# Patient Record
Sex: Male | Born: 1950 | Race: Black or African American | Hispanic: No | Marital: Single | Smoking: Never smoker
Health system: Southern US, Community
[De-identification: ages and names within clinical notes are randomized; demographics above are authoritative.]

## PROBLEM LIST (undated history)

## (undated) DIAGNOSIS — C61 Malignant neoplasm of prostate: Secondary | ICD-10-CM

---

## 2012-07-22 ENCOUNTER — Other Ambulatory Visit (HOSPITAL_COMMUNITY): Payer: Self-pay | Admitting: *Deleted

## 2012-07-22 ENCOUNTER — Other Ambulatory Visit: Payer: Self-pay | Admitting: *Deleted

## 2012-07-22 DIAGNOSIS — C61 Malignant neoplasm of prostate: Secondary | ICD-10-CM

## 2012-07-27 ENCOUNTER — Ambulatory Visit (HOSPITAL_COMMUNITY): Admission: RE | Admit: 2012-07-27 | Payer: Medicare Other | Source: Ambulatory Visit

## 2012-07-30 ENCOUNTER — Ambulatory Visit (HOSPITAL_COMMUNITY): Admission: RE | Admit: 2012-07-30 | Payer: Medicare Other | Source: Ambulatory Visit

## 2012-08-02 ENCOUNTER — Other Ambulatory Visit (HOSPITAL_COMMUNITY): Payer: Self-pay

## 2019-05-18 ENCOUNTER — Inpatient Hospital Stay (HOSPITAL_COMMUNITY): Payer: No Typology Code available for payment source

## 2019-05-18 ENCOUNTER — Encounter (HOSPITAL_COMMUNITY): Payer: Self-pay | Admitting: Pulmonary Disease

## 2019-05-18 ENCOUNTER — Emergency Department (HOSPITAL_COMMUNITY): Payer: No Typology Code available for payment source

## 2019-05-18 ENCOUNTER — Inpatient Hospital Stay (HOSPITAL_COMMUNITY)
Admission: EM | Admit: 2019-05-18 | Discharge: 2019-05-21 | DRG: 871 | Disposition: A | Discharge status: Hospice | Payer: No Typology Code available for payment source | Attending: Internal Medicine | Admitting: Internal Medicine

## 2019-05-18 DIAGNOSIS — E875 Hyperkalemia: Secondary | ICD-10-CM | POA: Diagnosis present

## 2019-05-18 DIAGNOSIS — A599 Trichomoniasis, unspecified: Secondary | ICD-10-CM | POA: Diagnosis present

## 2019-05-18 DIAGNOSIS — G9341 Metabolic encephalopathy: Secondary | ICD-10-CM | POA: Diagnosis present

## 2019-05-18 DIAGNOSIS — Z7952 Long term (current) use of systemic steroids: Secondary | ICD-10-CM

## 2019-05-18 DIAGNOSIS — G936 Cerebral edema: Secondary | ICD-10-CM | POA: Diagnosis present

## 2019-05-18 DIAGNOSIS — Z880 Allergy status to penicillin: Secondary | ICD-10-CM

## 2019-05-18 DIAGNOSIS — Z20822 Contact with and (suspected) exposure to covid-19: Secondary | ICD-10-CM | POA: Diagnosis present

## 2019-05-18 DIAGNOSIS — R7401 Elevation of levels of liver transaminase levels: Secondary | ICD-10-CM | POA: Diagnosis present

## 2019-05-18 DIAGNOSIS — R6521 Severe sepsis with septic shock: Secondary | ICD-10-CM | POA: Diagnosis present

## 2019-05-18 DIAGNOSIS — E872 Acidosis: Secondary | ICD-10-CM | POA: Diagnosis present

## 2019-05-18 DIAGNOSIS — Z792 Long term (current) use of antibiotics: Secondary | ICD-10-CM

## 2019-05-18 DIAGNOSIS — C786 Secondary malignant neoplasm of retroperitoneum and peritoneum: Secondary | ICD-10-CM | POA: Diagnosis present

## 2019-05-18 DIAGNOSIS — C61 Malignant neoplasm of prostate: Secondary | ICD-10-CM | POA: Diagnosis present

## 2019-05-18 DIAGNOSIS — R410 Disorientation, unspecified: Secondary | ICD-10-CM

## 2019-05-18 DIAGNOSIS — Z888 Allergy status to other drugs, medicaments and biological substances status: Secondary | ICD-10-CM

## 2019-05-18 DIAGNOSIS — E861 Hypovolemia: Secondary | ICD-10-CM | POA: Diagnosis present

## 2019-05-18 DIAGNOSIS — A419 Sepsis, unspecified organism: Principal | ICD-10-CM

## 2019-05-18 DIAGNOSIS — D61818 Other pancytopenia: Secondary | ICD-10-CM | POA: Diagnosis present

## 2019-05-18 DIAGNOSIS — C7951 Secondary malignant neoplasm of bone: Secondary | ICD-10-CM | POA: Diagnosis present

## 2019-05-18 DIAGNOSIS — Z7189 Other specified counseling: Secondary | ICD-10-CM

## 2019-05-18 DIAGNOSIS — R680 Hypothermia, not associated with low environmental temperature: Secondary | ICD-10-CM | POA: Diagnosis present

## 2019-05-18 DIAGNOSIS — E871 Hypo-osmolality and hyponatremia: Secondary | ICD-10-CM | POA: Diagnosis present

## 2019-05-18 DIAGNOSIS — N178 Other acute kidney failure: Secondary | ICD-10-CM | POA: Diagnosis present

## 2019-05-18 DIAGNOSIS — Z993 Dependence on wheelchair: Secondary | ICD-10-CM

## 2019-05-18 DIAGNOSIS — Z885 Allergy status to narcotic agent status: Secondary | ICD-10-CM

## 2019-05-18 DIAGNOSIS — C787 Secondary malignant neoplasm of liver and intrahepatic bile duct: Secondary | ICD-10-CM | POA: Diagnosis present

## 2019-05-18 DIAGNOSIS — R4182 Altered mental status, unspecified: Secondary | ICD-10-CM | POA: Diagnosis not present

## 2019-05-18 DIAGNOSIS — G893 Neoplasm related pain (acute) (chronic): Secondary | ICD-10-CM | POA: Diagnosis present

## 2019-05-18 DIAGNOSIS — E860 Dehydration: Secondary | ICD-10-CM | POA: Diagnosis present

## 2019-05-18 DIAGNOSIS — Z66 Do not resuscitate: Secondary | ICD-10-CM

## 2019-05-18 DIAGNOSIS — Z79891 Long term (current) use of opiate analgesic: Secondary | ICD-10-CM

## 2019-05-18 DIAGNOSIS — Z515 Encounter for palliative care: Secondary | ICD-10-CM

## 2019-05-18 DIAGNOSIS — N3000 Acute cystitis without hematuria: Secondary | ICD-10-CM | POA: Diagnosis present

## 2019-05-18 DIAGNOSIS — R569 Unspecified convulsions: Secondary | ICD-10-CM | POA: Diagnosis not present

## 2019-05-18 DIAGNOSIS — N179 Acute kidney failure, unspecified: Secondary | ICD-10-CM | POA: Diagnosis not present

## 2019-05-18 DIAGNOSIS — Z923 Personal history of irradiation: Secondary | ICD-10-CM

## 2019-05-18 DIAGNOSIS — C7931 Secondary malignant neoplasm of brain: Secondary | ICD-10-CM | POA: Diagnosis present

## 2019-05-18 DIAGNOSIS — L8990 Pressure ulcer of unspecified site, unspecified stage: Secondary | ICD-10-CM | POA: Insufficient documentation

## 2019-05-18 HISTORY — DX: Malignant neoplasm of prostate: C61

## 2019-05-18 LAB — POC SARS CORONAVIRUS 2 AG -  ED: SARS Coronavirus 2 Ag: NEGATIVE

## 2019-05-18 LAB — ETHANOL: Alcohol, Ethyl (B): 40 mg/dL — ABNORMAL HIGH (ref <10)

## 2019-05-18 LAB — COMPREHENSIVE METABOLIC PANEL
ALT: 68 U/L — ABNORMAL HIGH (ref 0–44)
ALT: 66 U/L — ABNORMAL HIGH (ref 0–44)
AST: 314 U/L — ABNORMAL HIGH (ref 15–41)
AST: 264 U/L — ABNORMAL HIGH (ref 15–41)
Albumin: 1.7 g/dL — ABNORMAL LOW (ref 3.5–5.0)
Albumin: 1.7 g/dL — ABNORMAL LOW (ref 3.5–5.0)
Alkaline Phosphatase: 2719 U/L — ABNORMAL HIGH (ref 38–126)
Alkaline Phosphatase: 2862 U/L — ABNORMAL HIGH (ref 38–126)
Anion gap: 22 — ABNORMAL HIGH (ref 5–15)
Anion gap: 16 — ABNORMAL HIGH (ref 5–15)
BUN: 40 mg/dL — ABNORMAL HIGH (ref 8–23)
BUN: 39 mg/dL — ABNORMAL HIGH (ref 8–23)
CO2: 11 mmol/L — ABNORMAL LOW (ref 22–32)
CO2: 14 mmol/L — ABNORMAL LOW (ref 22–32)
Calcium: 6.9 mg/dL — ABNORMAL LOW (ref 8.9–10.3)
Calcium: 6.6 mg/dL — ABNORMAL LOW (ref 8.9–10.3)
Chloride: 98 mmol/L (ref 98–111)
Chloride: 103 mmol/L (ref 98–111)
Creatinine, Ser: 4.1 mg/dL — ABNORMAL HIGH (ref 0.61–1.24)
Creatinine, Ser: 3 mg/dL — ABNORMAL HIGH (ref 0.61–1.24)
GFR calc Af Amer: 16 mL/min — ABNORMAL LOW (ref >60)
GFR calc Af Amer: 24 mL/min — ABNORMAL LOW (ref >60)
GFR calc non Af Amer: 14 mL/min — ABNORMAL LOW (ref >60)
GFR calc non Af Amer: 21 mL/min — ABNORMAL LOW (ref >60)
Glucose, Bld: 87 mg/dL (ref 70–99)
Glucose, Bld: 109 mg/dL — ABNORMAL HIGH (ref 70–99)
Potassium: 5.5 mmol/L — ABNORMAL HIGH (ref 3.5–5.1)
Potassium: 4.9 mmol/L (ref 3.5–5.1)
Sodium: 131 mmol/L — ABNORMAL LOW (ref 135–145)
Sodium: 133 mmol/L — ABNORMAL LOW (ref 135–145)
Total Bilirubin: 3 mg/dL — ABNORMAL HIGH (ref 0.3–1.2)
Total Bilirubin: 4.4 mg/dL — ABNORMAL HIGH (ref 0.3–1.2)
Total Protein: 4.3 g/dL — ABNORMAL LOW (ref 6.5–8.1)
Total Protein: 4.2 g/dL — ABNORMAL LOW (ref 6.5–8.1)

## 2019-05-18 LAB — HEMOGLOBIN AND HEMATOCRIT, BLOOD
HCT: 26.1 % — ABNORMAL LOW (ref 39.0–52.0)
Hemoglobin: 8.5 g/dL — ABNORMAL LOW (ref 13.0–17.0)

## 2019-05-18 LAB — CBC WITH DIFFERENTIAL/PLATELET
Abs Immature Granulocytes: 0.03 10*3/uL (ref 0.00–0.07)
Basophils Absolute: 0 10*3/uL (ref 0.0–0.1)
Basophils Relative: 0 %
Eosinophils Absolute: 0 10*3/uL (ref 0.0–0.5)
Eosinophils Relative: 0 %
HCT: 21.6 % — ABNORMAL LOW (ref 39.0–52.0)
Hemoglobin: 6.1 g/dL — CL (ref 13.0–17.0)
Immature Granulocytes: 1 %
Lymphocytes Relative: 39 %
Lymphs Abs: 0.9 10*3/uL (ref 0.7–4.0)
MCH: 25 pg — ABNORMAL LOW (ref 26.0–34.0)
MCHC: 28.2 g/dL — ABNORMAL LOW (ref 30.0–36.0)
MCV: 88.5 fL (ref 80.0–100.0)
Monocytes Absolute: 0.1 10*3/uL (ref 0.1–1.0)
Monocytes Relative: 4 %
Neutro Abs: 1.3 10*3/uL — ABNORMAL LOW (ref 1.7–7.7)
Neutrophils Relative %: 56 %
Platelets: 16 10*3/uL — CL (ref 150–400)
RBC: 2.44 MIL/uL — ABNORMAL LOW (ref 4.22–5.81)
RDW: 20.5 % — ABNORMAL HIGH (ref 11.5–15.5)
WBC: 2.3 10*3/uL — ABNORMAL LOW (ref 4.0–10.5)
nRBC: 7 % — ABNORMAL HIGH (ref 0.0–0.2)

## 2019-05-18 LAB — URINALYSIS, ROUTINE W REFLEX MICROSCOPIC
Glucose, UA: NEGATIVE mg/dL
Ketones, ur: 15 mg/dL — AB
Nitrite: POSITIVE — AB
Protein, ur: 100 mg/dL — AB
Specific Gravity, Urine: >1.03 — ABNORMAL HIGH (ref 1.005–1.030)
pH: 5 (ref 5.0–8.0)

## 2019-05-18 LAB — AMMONIA: Ammonia: 33 umol/L (ref 9–35)

## 2019-05-18 LAB — RESPIRATORY PANEL BY RT PCR (FLU A&B, COVID)
Influenza A by PCR: NEGATIVE
Influenza B by PCR: NEGATIVE
SARS Coronavirus 2 by RT PCR: NEGATIVE

## 2019-05-18 LAB — LACTIC ACID, PLASMA
Lactic Acid, Venous: 9.6 mmol/L (ref 0.5–1.9)
Lactic Acid, Venous: 6.8 mmol/L (ref 0.5–1.9)
Lactic Acid, Venous: 2.9 mmol/L (ref 0.5–1.9)

## 2019-05-18 LAB — URINALYSIS, MICROSCOPIC (REFLEX): Squamous Epithelial / HPF: NONE SEEN (ref 0–5)

## 2019-05-18 LAB — MRSA PCR SCREENING: MRSA by PCR: NEGATIVE

## 2019-05-18 LAB — CBG MONITORING, ED: Glucose-Capillary: 76 mg/dL (ref 70–99)

## 2019-05-18 LAB — T4, FREE: Free T4: 0.86 ng/dL (ref 0.61–1.12)

## 2019-05-18 LAB — PROCALCITONIN: Procalcitonin: 28.7 ng/mL

## 2019-05-18 LAB — HIV ANTIBODY (ROUTINE TESTING W REFLEX): HIV Screen 4th Generation wRfx: NONREACTIVE

## 2019-05-18 LAB — TSH: TSH: 3.98 u[IU]/mL (ref 0.350–4.500)

## 2019-05-18 LAB — GLUCOSE, CAPILLARY
Glucose-Capillary: 106 mg/dL — ABNORMAL HIGH (ref 70–99)
Glucose-Capillary: 113 mg/dL — ABNORMAL HIGH (ref 70–99)
Glucose-Capillary: 96 mg/dL (ref 70–99)

## 2019-05-18 LAB — PREPARE RBC (CROSSMATCH)

## 2019-05-18 LAB — ABO/RH: ABO/RH(D): B POS

## 2019-05-18 MED ORDER — NOREPINEPHRINE 4 MG/250ML-% IV SOLN
0.0000 ug/min | INTRAVENOUS | Status: DC
  Administered 2019-05-18: 3 ug/min via INTRAVENOUS
  Administered 2019-05-18: 11:00:00 5 ug/min via INTRAVENOUS
  Filled 2019-05-18 (×2): qty 250

## 2019-05-18 MED ORDER — DEXAMETHASONE SODIUM PHOSPHATE 10 MG/ML IJ SOLN: 10.0000 mg | Freq: Once | INTRAMUSCULAR | Status: DC

## 2019-05-18 MED ORDER — PIPERACILLIN-TAZOBACTAM 3.375 G IVPB 30 MIN
3.3750 g | Freq: Once | INTRAVENOUS | Status: AC
  Administered 2019-05-18: 11:00:00 3.375 g via INTRAVENOUS
  Filled 2019-05-18: qty 50

## 2019-05-18 MED ORDER — FENTANYL CITRATE (PF) 100 MCG/2ML IJ SOLN
25.0000 ug | INTRAMUSCULAR | Status: DC | PRN
  Administered 2019-05-18 – 2019-05-19 (×5): 25 ug via INTRAVENOUS
  Filled 2019-05-18 (×5): qty 2

## 2019-05-18 MED ORDER — VANCOMYCIN HCL 1500 MG/300ML IV SOLN
1500.0000 mg | Freq: Once | INTRAVENOUS | Status: AC
  Administered 2019-05-18: 11:00:00 1500 mg via INTRAVENOUS
  Filled 2019-05-18: qty 300

## 2019-05-18 MED ORDER — SODIUM CHLORIDE 0.9% IV SOLUTION: Freq: Once | INTRAVENOUS | Status: DC

## 2019-05-18 MED ORDER — SODIUM CHLORIDE 0.9 % IV SOLN
1.0000 g | INTRAVENOUS | Status: DC
  Administered 2019-05-18: 17:00:00 1 g via INTRAVENOUS
  Filled 2019-05-18 (×2): qty 10

## 2019-05-18 MED ORDER — LACTATED RINGERS IV BOLUS
1000.0000 mL | Freq: Once | INTRAVENOUS | Status: AC
  Administered 2019-05-18: 18:00:00 1000 mL via INTRAVENOUS

## 2019-05-18 MED ORDER — METRONIDAZOLE IN NACL 5-0.79 MG/ML-% IV SOLN
500.0000 mg | Freq: Three times a day (TID) | INTRAVENOUS | Status: DC
  Administered 2019-05-18 – 2019-05-19 (×4): 500 mg via INTRAVENOUS
  Filled 2019-05-18 (×4): qty 100

## 2019-05-18 MED ORDER — SODIUM CHLORIDE 0.9 % IV SOLN: INTRAVENOUS | Status: DC

## 2019-05-18 MED ORDER — SODIUM CHLORIDE 0.9 % IV BOLUS
1000.0000 mL | Freq: Once | INTRAVENOUS | Status: AC
  Administered 2019-05-18: 1000 mL via INTRAVENOUS

## 2019-05-18 MED ORDER — LACTATED RINGERS IV SOLN: INTRAVENOUS | Status: DC

## 2019-05-18 MED ORDER — DEXAMETHASONE SODIUM PHOSPHATE 10 MG/ML IJ SOLN
8.0000 mg | Freq: Two times a day (BID) | INTRAMUSCULAR | Status: DC
  Administered 2019-05-18: 23:00:00 8 mg via INTRAVENOUS
  Filled 2019-05-18 (×2): qty 0.8

## 2019-05-18 MED ORDER — SODIUM CHLORIDE 0.9 % IV BOLUS (SEPSIS)
500.0000 mL | Freq: Once | INTRAVENOUS | Status: AC
  Administered 2019-05-18: 10:00:00 500 mL via INTRAVENOUS

## 2019-05-18 MED ORDER — HYDROCORTISONE NA SUCCINATE PF 100 MG IJ SOLR: 50.0000 mg | Freq: Four times a day (QID) | INTRAMUSCULAR | Status: DC

## 2019-05-18 MED ORDER — CHLORHEXIDINE GLUCONATE CLOTH 2 % EX PADS
6.0000 | MEDICATED_PAD | Freq: Every day | CUTANEOUS | Status: DC
  Administered 2019-05-19 – 2019-05-21 (×2): 6 via TOPICAL

## 2019-05-18 MED ORDER — SODIUM CHLORIDE 0.9 % IV SOLN
INTRAVENOUS | Status: DC | PRN
  Administered 2019-05-18: 17:00:00 250 mL via INTRAVENOUS

## 2019-05-18 NOTE — ED Notes (Signed)
Help get patient undressed on the monitor patient is resting with call  Bell in reach and nurses at bedside

## 2019-05-18 NOTE — Progress Notes (Signed)
EEG complete - results pending 

## 2019-05-18 NOTE — Procedures (Signed)
Patient Name: Harry Dixon  MRN: [id number]  Epilepsy Attending: Lora Havens  Referring Physician/Provider: Salvadore Dom, NP Date: 05/18/2019  Duration: 23.56 mins  Patient history: 69 year old male with stage IV prostatic cancer, brain mets who presented with altered mental status.  EEG to evaluate for seizures.  Level of alertness: awake  AEDs during EEG study: None  Technical aspects: This EEG study was done with scalp electrodes positioned according to the 10-20 International system of electrode placement. Electrical activity was acquired at a sampling rate of 500Hz  and reviewed with a high frequency filter of 70Hz  and a low frequency filter of 1Hz . EEG data were recorded continuously and digitally stored.   Description: During awake state, no clear posterior dominant rhythm was seen.  EEG showed spikes at 1 Hz arising from right frontal-anterior temporal region (maximal F8).  EEG also showed continuous generalized and lateralized right hemisphere 3 to 6 Hz theta-delta slowing. Hyperventilation and photic stimulation were not performed due to AMS.  Abnormality -Spikes, right frontal-anterior temporal region -Continuous slow, generalized and lateralized right hemisphere  IMPRESSION: This study showed evidence of epileptogenicity arising from right frontal-anterior temporal region.  Additionally there was evidence of cortical dysfunction right hemisphere likely secondary to underlying metastatic disease. No seizures were seen throughout the recording.    Harry Dixon [name]

## 2019-05-18 NOTE — ED Triage Notes (Signed)
Pt here from home , pt had a syncopal episode while trying to get into w/c , pt received 2mg  of narcan for resp rate of 4 to 6 , pt also hypotensive , cbg 114 , pt is treated for CA at the [place]

## 2019-05-18 NOTE — ED Notes (Signed)
Pt more awake now , following commands at times , restraints removed for now , family at bedside pt remains on the levophed gtt

## 2019-05-18 NOTE — H&P (Signed)
NAME:  Harry Dixon, MRN:  [id number], DOB:  [birth year], LOS: 0 ADMISSION DATE:  05/18/2019, CONSULTATION DATE:  2/3 REFERRING MD:  Tyrone Nine, CHIEF COMPLAINT:  sepsis   Brief History   55 yom w/ what appears to be progressive stage IV prostate CA (initially involving mostly spine and pelvis). Not on therapy since June 2020. Presents 2/3 w/ working dx of acute metabolic encephalopathy w/ septic shock felt 2/2 UT source but also dx eval suggesting marked progression of his metastatic disease now involving: retroperitoneal cavity, liver and brain.   History of present illness   This is a 69 year old male w/ stage IV prostate cancer. Not on therapy since June 2020 due to poor tolerance. Was in usual state of health until evening of 2/2 when daughter noted he was more disoriented. Prior to that family noted decreased oral intake.  On the am of 2/3 he was much more confused. Had a syncopal event when family was trying to get him into w/c. EMS called on arrival he was breathing 4-6x/min. Was given narcan w/ some improvement. Was transferred to the ED. On arrival he was minimally responsive, hypotensive, lactic acid was 9.  Dx eval in ER:  Afebrile, tachycardic, K 5.5, scr 4.10, ast 314, alt 68, alk po4 2719, hgb 6.1, PLT 16, ammonia 33, urine + Trichomonas  ->CT abd/pelvis c/w extensive metastatic prostate CA w/ retroperitoneal LAN and diffuse hepatic and skeletal mets.  ->CT brain w/ diffuse skull based infiltration and evidence of multifocal right hemisphere brain mets w/ milf-mod vasogenic edema  Treatment in ER: IV fluids (81m/kg) Blood transfusion (x2 units) Flagyl and rocephin initiated PCCM asked to admit  Past Medical History  Metastatic prostate cancer (no chemo or radiation since June 2020), metastasis involving: lower spine, pelvis. 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 Needs help getting dressed.  Significant Hospital Events   2/3 admitted w/ working dx of septic shock and progressive metastasis of underlying prostate CA. IVFs, 2 units PRBC and abx initiated.   Consults:  Oncology   Procedures:    Significant Diagnostic Tests:  CT brain 2/3: 1. Diffuse skull base infiltration by tumor/metastasis, and evidence of multifocal right hemisphere brain metastases with mild-to-moderate vasogenic edema. 2. No significant intracranial mass effect. CT pevis 2/3: . 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 Metastatic retroperitoneal lymphadenopathy. Diffuse hepatic metastases, and diffuse skeletal metastases as above. 2. Trace bilateral pleural effusions. Renal [place]2/3>>> Micro Data:  UA 2/3 Trichomonas UC 2/3>>> BC 2/3>>> SARS POC : neg resp virus including COVID>>> RPR 2/3>>.    Antimicrobials:  Flagyl 2/3>>> Rocephin 2/3>>  Interim history/subjective:  More awake w/ IVFs   Objective   Blood pressure 104/66, pulse (Abnormal) 102, temperature 98.4 F (36.9 C), temperature source Axillary, resp. rate 14, SpO2 96 %.       No intake or output data in the 24 hours ending 05/18/19 1325 There were no vitals filed for this visit.  Examination: General: 69year old aam resting in bed. Still agitated at times when awake. Then goes back to sleep HENT: [place] sclera are mildly icteric MM no JVD Lungs: clear no accessory use  Cardiovascular: rrr w/out MRG Abdomen: not tender + bowel sounds  Extremities: warm LE very painful to palp chronic stasis changed.  Neuro: awake w/ voice. Confused. Moves all ext but mobility limited by  pain  GU: inc urine on arrival   Resolved Hospital Problem list     Assessment & Plan:  Acute metabolic encephalopathy. Multifactorial: septic shock, renal failure, acidosis, chronic narcotics but ALSO CT imaging of brain with acute finding of brain metastasis.   -neuro has improved w/ IV hydration to some degree. But given finding also worried about seizure Plan Supportive care->treating infection Spot EEG IV decadron '10mg'$  IV now then change to 8 mg bid  Serial neuro checks Holding sedating meds  Septic shock w/ multiple organ failure. Appears to be urinary tract source in setting of known prostate cancer.  -+  trichomonas  On UA. Worry polymicrobial.  Plan Admit to ICU Culture urine Culture blood Empiric rocephin  Cont flagyl  Sent RPR Cont IVFs in addition to transfusion  Peripheral norepi  Decadron for vasogenic edema should also help w/ stress dose steroids    Pancytopenia: Symptomatic anemia (acute on chronic), thrombocytopenia and mild neutropenia -suspect due to mix of malignancy and sepsis   Plan Transfuse 2 units.  F/u cbc tranfuse for hgb < 7 plts < 10 K or if develops evidence of bleeding  Acute renal failure Plan IVFs Renal dose meds Strict I&O Check bladder scan Try to avoid foley if able but obstruction is a concern  Renal US r/o obstruction   Severe metabolic acidosis (anion-gap/lactic acidosis), mild hyperkalemia  Plan Cont IVFs IV hydration  Serial chemistries F/u lactic acid  abg  Abnormal LFTs  -suspect this reflects a mix of liver metastasis AND shock liver Plan Trend->obtain am LFTs  Progressive metastatic prostate cancer  -initially mets to spine and pelvis. Now involving retroperitoneal cavity, liver and brain. Has vasogenic edema  -gets care at [facility] Will add decadron as above Has not had therapy since June. Seems unlikely to be candidate for treatment in future other than for palliative care.  Will d/w attending re: when or if to involve [facility] oncology   Best practice:  Diet: NPO except meds Pain/Anxiety/Delirium protocol (if indicated): NA VAP protocol (if indicated): NA DVT prophylaxis: scd GI prophylaxis: na Glucose control: ssi (w steroids) Mobility: BR Code Status: full  code-->discussed w/ daughter, would not want prolonged care or SNF  Family Communication: updated daughter on plan of care  Disposition:  Awaiting ICU admit  Labs   CBC: Recent Labs  Lab 05/18/19 0901  WBC 2.3*  NEUTROABS 1.3*  HGB 6.1*  HCT 21.6*  MCV 88.5  PLT 16*    Basic Metabolic Panel: Recent Labs  Lab 05/18/19 0901  NA 131*  K 5.5*  CL 98  CO2 11*  GLUCOSE 87  BUN 40*  CREATININE 4.10*  CALCIUM 6.9*   GFR: CrCl cannot be calculated (Unknown ideal weight.). Recent Labs  Lab 05/18/19 0901 05/18/19 0902 05/18/19 1115  WBC 2.3*  --   --   LATICACIDVEN  --  9.6* 6.8*    Liver Function Tests: Recent Labs  Lab 05/18/19 0901  AST 314*  ALT 68*  ALKPHOS 2,719*  BILITOT 3.0*  PROT 4.3*  ALBUMIN 1.7*   No results for input(s): LIPASE, AMYLASE in the last 168 hours. Recent Labs  Lab 05/18/19 0926  AMMONIA 33    ABG No results found for: PHART, PCO2ART, PO2ART, HCO3, TCO2, ACIDBASEDEF, O2SAT   Coagulation Profile: No results for input(s): INR, PROTIME in the last 168 hours.  Cardiac Enzymes: No results for input(s): CKTOTAL, CKMB, CKMBINDEX, TROPONINI in the last 168 hours.  HbA1C: No results found for:  HGBA1C  CBG: Recent Labs  Lab 05/18/19 0917  GLUCAP 76    Review of Systems:   Not able  Past Medical History  He,  has no past medical history on file.   Surgical History      Social History  Lives w/ daughter   Family History   His family history is not on file.   Allergies Allergies  Allergen Reactions  . Methadone Nausea And Vomiting  . Penicillin G Swelling    Did it involve swelling of the face/tongue/throat, SOB, or low BP? Yes Did it involve sudden or severe rash/hives, skin peeling, or any reaction on the inside of your mouth or nose? No Did you need to seek medical attention at a hospital or doctor's office? No When did it last happen?69 y.o. If all above answers are "NO", may proceed with cephalosporin use.    Pt. States Mouth swells and gets hives as well as getting jittery.  . Metformin Nausea Only     Home Medications  Prior to Admission medications   Medication Sig Start Date End Date Taking? Authorizing Provider  LORazepam (ATIVAN) 0.5 MG tablet Take 0.5 mg by mouth every 8 (eight) hours as needed for anxiety.   Yes [provider]  naproxen sodium (ALEVE) 220 MG tablet Take 220 mg by mouth 3 (three) times daily.   Yes [provider]  ondansetron (ZOFRAN) 4 MG tablet Take 4 mg by mouth every 8 (eight) hours as needed for nausea or vomiting.   Yes [provider]  oxyCODONE (OXY IR/ROXICODONE) 5 MG immediate release tablet Take 5 mg by mouth 3 (three) times daily.    Yes [provider]  predniSONE (DELTASONE) 5 MG tablet Take 5 mg by mouth 2 (two) times daily with a meal.   Yes [provider]  sulfamethoxazole-trimethoprim (BACTRIM DS) 800-160 MG tablet Take 1 tablet by mouth 2 (two) times daily.   Yes [provider]     Critical care time:  45 minutes    Erick Colace ACNP-BC [place] Pager # [phone] OR # [phone] if no answer

## 2019-05-18 NOTE — ED Provider Notes (Signed)
[facility] EMERGENCY DEPARTMENT Provider Note   CSN: [id number] Arrival date & time: 05/18/19  [id number]     History No chief complaint on file.   Harry Dixon is a 69 y.o. male.  69 yo M with a chief complaints of altered mental status.  Presumably found that way this morning.  Patient is undergoing radiation therapy for prostate cancer.  Primarily through the [place] but also at [facility].  Unable to provide any history.  Per EMS the patient was completely unresponsive breathing between 4 and 6 times a minute.  Had improvement of that with Narcan but was persistently altered.  The history is provided by the patient.  Illness Severity:  Severe Onset quality:  Sudden Duration:  1 day Timing:  Constant Progression:  Unchanged Chronicity:  New Associated symptoms: no abdominal pain, no chest pain, no congestion, no diarrhea, no fever, no headaches, no myalgias, no rash, no shortness of breath and no vomiting        History reviewed. No pertinent past medical history.  Patient Active Problem List   Diagnosis Date Noted  . Septic shock ([place]) 05/18/2019    History reviewed. No pertinent surgical history.     No family history on file.  Social History   Tobacco Use  . Smoking status: Not on file  Substance Use Topics  . Alcohol use: Not on file  . Drug use: Not on file    Home Medications Prior to Admission medications   Medication Sig Start Date End Date Taking? Authorizing Provider  LORazepam (ATIVAN) 0.5 MG tablet Take 0.5 mg by mouth every 8 (eight) hours as needed for anxiety.   Yes [provider]  naproxen sodium (ALEVE) 220 MG tablet Take 220 mg by mouth 3 (three) times daily.   Yes [provider]  ondansetron (ZOFRAN) 4 MG tablet Take 4 mg by mouth every 8 (eight) hours as needed for nausea or vomiting.   Yes [provider]  oxyCODONE (OXY IR/ROXICODONE) 5 MG immediate release tablet Take 5 mg by mouth 3 (three) times daily.     Yes [provider]  predniSONE (DELTASONE) 5 MG tablet Take 5 mg by mouth 2 (two) times daily with a meal.   Yes [provider]  sulfamethoxazole-trimethoprim (BACTRIM DS) 800-160 MG tablet Take 1 tablet by mouth 2 (two) times daily.   Yes [provider]    Allergies    Methadone, Penicillin g, and Metformin  Review of Systems   Review of Systems  Unable to perform ROS: Mental status change  Constitutional: Negative for chills and fever.  HENT: Negative for congestion and facial swelling.   Eyes: Negative for discharge and visual disturbance.  Respiratory: Negative for shortness of breath.   Cardiovascular: Negative for chest pain and palpitations.  Gastrointestinal: Negative for abdominal pain, diarrhea and vomiting.  Musculoskeletal: Negative for arthralgias and myalgias.  Skin: Negative for color change and rash.  Neurological: Negative for tremors, syncope and headaches.  Psychiatric/Behavioral: Negative for confusion and dysphoric mood.    Physical Exam Updated Vital Signs BP 107/67   Pulse (!) 109   Temp 98.4 F (36.9 C) (Axillary)   Resp 18   SpO2 98%   Physical Exam Vitals and nursing note reviewed.  Constitutional:      Appearance: He is well-developed.     Comments: Chronically ill-appearing  HENT:     Head: Normocephalic and atraumatic.  Eyes:     Pupils: Pupils are equal, round, and  reactive to light.  Neck:     Vascular: No JVD.  Cardiovascular:     Rate and Rhythm: Normal rate and regular rhythm.     Heart sounds: No murmur. No friction rub. No gallop.   Pulmonary:     Effort: No respiratory distress.     Breath sounds: No wheezing.  Abdominal:     General: There is no distension.     Tenderness: There is no abdominal tenderness. There is no guarding or rebound.  Musculoskeletal:        General: Normal range of motion.     Cervical back: Normal range of motion and neck supple.  Skin:    Coloration: Skin is not pale.       Findings: No rash.  Neurological:     Mental Status: He is alert.     Comments: Moving all 4 extremities spontaneously.  Mumbling incoherently.  Psychiatric:        Behavior: Behavior is uncooperative.     ED Results / Procedures / Treatments   Labs (all labs ordered are listed, but only abnormal results are displayed) Labs Reviewed  COMPREHENSIVE METABOLIC PANEL - Abnormal; Notable for the following components:      Result Value   Sodium 131 (*)    Potassium 5.5 (*)    CO2 11 (*)    BUN 40 (*)    Creatinine, Ser 4.10 (*)    Calcium 6.9 (*)    Total Protein 4.3 (*)    Albumin 1.7 (*)    AST 314 (*)    ALT 68 (*)    Alkaline Phosphatase 2,719 (*)    Total Bilirubin 3.0 (*)    GFR calc non Af Amer 14 (*)    GFR calc Af Amer 16 (*)    Anion gap 22 (*)    All other components within normal limits  ETHANOL - Abnormal; Notable for the following components:   Alcohol, Ethyl (B) 40 (*)    All other components within normal limits  CBC WITH DIFFERENTIAL/PLATELET - Abnormal; Notable for the following components:   WBC 2.3 (*)    RBC 2.44 (*)    Hemoglobin 6.1 (*)    HCT 21.6 (*)    MCH 25.0 (*)    MCHC 28.2 (*)    RDW 20.5 (*)    Platelets 16 (*)    nRBC 7.0 (*)    Neutro Abs 1.3 (*)    All other components within normal limits  URINALYSIS, ROUTINE W REFLEX MICROSCOPIC - Abnormal; Notable for the following components:   Color, Urine BROWN (*)    APPearance CLOUDY (*)    Specific Gravity, Urine >1.030 (*)    Hgb urine dipstick LARGE (*)    Bilirubin Urine MODERATE (*)    Ketones, ur 15 (*)    Protein, ur 100 (*)    Nitrite POSITIVE (*)    Leukocytes,Ua MODERATE (*)    All other components within normal limits  LACTIC ACID, PLASMA - Abnormal; Notable for the following components:   Lactic Acid, Venous 9.6 (*)    All other components within normal limits  URINALYSIS, MICROSCOPIC (REFLEX) - Abnormal; Notable for the following components:   Bacteria, UA MANY (*)     Trichomonas, UA PRESENT (*)    All other components within normal limits  LACTIC ACID, PLASMA - Abnormal; Notable for the following components:   Lactic Acid, Venous 6.8 (*)    All other components within normal limits  GLUCOSE,  CAPILLARY - Abnormal; Notable for the following components:   Glucose-Capillary 106 (*)    All other components within normal limits  RESPIRATORY PANEL BY RT PCR (FLU A&B, COVID)  URINE CULTURE  CULTURE, BLOOD (ROUTINE X 2)  CULTURE, BLOOD (ROUTINE X 2)  MRSA PCR SCREENING  AMMONIA  TSH  T4, FREE  HIV ANTIBODY (ROUTINE TESTING W REFLEX)  RPR  PATHOLOGIST SMEAR REVIEW  COMPREHENSIVE METABOLIC PANEL  COMPREHENSIVE METABOLIC PANEL  LACTIC ACID, PLASMA  LACTIC ACID, PLASMA  PROCALCITONIN  CBG MONITORING, ED  POC SARS CORONAVIRUS 2 AG -  ED  TYPE AND SCREEN  PREPARE RBC (CROSSMATCH)  ABO/RH  GC/CHLAMYDIA PROBE AMP ([place]) NOT AT [facility]    EKG EKG Interpretation  Date/Time:  Wednesday May 18 2019 08:55:51 EST Ventricular Rate:  91 PR Interval:    QRS Duration: 99 QT Interval:  391 QTC Calculation: 482 R Axis:   -11 Text Interpretation: Sinus rhythm Borderline prolonged QT interval No old tracing to compare Confirmed by Deno Etienne [phone]) on 05/18/2019 9:04:34 AM   Radiology CT ABDOMEN PELVIS WO CONTRAST  Result Date: 05/18/2019 CLINICAL DATA:  Abdominal pain, history of prostate cancer EXAM: CT ABDOMEN AND PELVIS WITHOUT CONTRAST TECHNIQUE: Multidetector CT imaging of the abdomen and pelvis was performed following the standard protocol without IV contrast. COMPARISON:  None. FINDINGS: Lower chest: There are small bilateral pleural effusions. No acute parenchymal lung disease. Incidental bilateral gynecomastia. Hepatobiliary: Too numerous to count hypodense masses throughout the liver compatible with diffuse hepatic metastases. Largest lesion within the inferior right lobe measuring approximately 4.5 cm reference image 33 of series 2.  Gallbladder is unremarkable. No biliary dilatation. Pancreas: Pancreas is unremarkable. Spleen: Spleen is normal in appearance and size. Adrenals/Urinary Tract: No evidence of urinary tract calculi or obstructive uropathy. Kidneys and adrenal glands are grossly unremarkable. Diffuse bladder wall thickening may reflect chronic bladder outlet obstruction. Stomach/Bowel: No evidence of bowel obstruction or ileus. Normal appendix right lower quadrant. Vascular/Lymphatic: There is extensive retroperitoneal lymphadenopathy. Index left para-aortic lymph node reference image 36 series 2 measures 18 x 22 mm. Lymphadenopathy extends to the pelvis, with largest left external iliac chain lymph node measuring 26 x 11 mm reference image 76 of series 2. No evidence of aortic aneurysm. Mild atherosclerosis of the distal aorta and its branches. Reproductive: Fiduciary markers are seen within the prostate. Prostate measures 2.8 x 3.5 by 3.1 cm. Other: No abdominal wall hernia.  No free fluid. Musculoskeletal: There is extensive diffuse bony metastatic disease involving all visualized bony structures. No evidence of pathologic fracture. Reconstructed images demonstrate no additional findings. IMPRESSION: 1. 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 Metastatic retroperitoneal lymphadenopathy. Diffuse hepatic metastases, and diffuse skeletal metastases as above. 2. Trace bilateral pleural effusions. Electronically Signed   By: Randa Ngo M.D.   On: 05/18/2019 12:26   CT HEAD WO CONTRAST  Result Date: 05/18/2019 CLINICAL DATA:  69 year old male with encephalopathy. "Unspecified cancer". EXAM: CT HEAD WITHOUT CONTRAST TECHNIQUE: Contiguous axial images were obtained from the base of the skull through the vertex without intravenous contrast. COMPARISON:  None. FINDINGS: Brain: Hypodensity in the right inferior frontal gyrus most suggestive of mass related vasogenic edema (series 2, image 14). Mild regional mass  effect including on the right frontal horn. Additional edema suspected in the right middle frontal gyrus on coronal image 20. Additionally, there is probable edema in the anterior right temporal lobe best seen on sagittal image 20. But no left hemisphere or posterior fossa  vasogenic edema identified. No associated intracranial hemorrhage. No midline shift or loss of basilar cisterns. No ventriculomegaly. No cortically based acute infarct identified. Vascular: Calcified atherosclerosis at the skull base. No suspicious intracranial vascular hyperdensity. Skull: Permeative, moth eaten appearance of bone throughout the skull base, including the central skull base, visible mandible, zygoma and maxilla. Smaller lytic lesions are scattered in the calvarium mostly along the coronal sutures. No definite extraosseous extension of tumor is identified by plain CT. Sinuses/Orbits: Abnormal sphenoid and right posterior ethmoid sinus opacification in conjunction with the highly abnormal appearance of the skull base. Similar partially opacified bilateral mastoid air cells and petrous apex air cells. The right tympanic cavity is partially opacified. Other: No orbit or scalp soft tissue mass identified. IMPRESSION: 1. Diffuse skull base infiltration by tumor/metastasis, and evidence of multifocal right hemisphere brain metastases with mild-to-moderate vasogenic edema. 2. No significant intracranial mass effect. Electronically Signed   By: Genevie Ann M.D.   On: 05/18/2019 12:34   DG Chest Port 1 View  Result Date: 05/18/2019 CLINICAL DATA:  Altered mental status, history of unspecified cancer EXAM: PORTABLE CHEST 1 VIEW COMPARISON:  None. FINDINGS: Diffuse patchy sclerotic change in the visualized osseous structures. Normal cardiomediastinal silhouette with normal heart size. No pneumothorax. No pleural effusion. Lungs appear clear, with no acute consolidative airspace disease and no pulmonary edema. No displaced fractures in the  visualized chest. IMPRESSION: 1. No active cardiopulmonary disease. 2. Diffuse patchy sclerotic change in the visualized osseous structures compatible with osteoblastic metastases. Electronically Signed   By: Ilona Sorrel M.D.   On: 05/18/2019 09:35    Procedures Procedures (including critical care time)  Medications Ordered in ED Medications  metroNIDAZOLE (FLAGYL) IVPB 500 mg (500 mg Intravenous New Bag/Given 05/18/19 1357)  norepinephrine (LEVOPHED) 4mg  in 2102mL premix infusion (5 mcg/min Intravenous New Bag/Given 05/18/19 1129)  cefTRIAXone (ROCEPHIN) 1 g in sodium chloride 0.9 % 100 mL IVPB (has no administration in time range)  dexamethasone (DECADRON) injection 10 mg (has no administration in time range)    Followed by  dexamethasone (DECADRON) injection 8 mg (has no administration in time range)  0.9 %  sodium chloride infusion (has no administration in time range)  fentaNYL (SUBLIMAZE) injection 25 mcg (has no administration in time range)  sodium chloride 0.9 % bolus 1,000 mL (0 mLs Intravenous Stopped 05/18/19 0959)  sodium chloride 0.9 % bolus 1,000 mL (0 mLs Intravenous Stopped 05/18/19 1105)  vancomycin (VANCOREADY) IVPB 1500 mg/300 mL (0 mg Intravenous Stopped 05/18/19 1356)  piperacillin-tazobactam (ZOSYN) IVPB 3.375 g (0 g Intravenous Stopped 05/18/19 1104)  sodium chloride 0.9 % bolus 500 mL (0 mLs Intravenous Stopped 05/18/19 1228)    ED Course  I have reviewed the triage vital signs and the nursing notes.  Pertinent labs & imaging results that were available during my care of the patient were reviewed by me and considered in my medical decision making (see chart for details).    MDM Rules/Calculators/A&P                      69 yo M with a chief complaints of altered mental status.  Brought in by EMS and found to be significantly altered.  Bradypnea.  Given narcan with come improvement but continued confusion.  Will obtain AMS workup, CT head, sepsis eval.  Hypothermic and  hypotensive on arrival add thyroid studies.    I was able to talk with the family on the phone.  Per his daughter  the patient has not been eating and drinking for the last couple weeks.  He has had significant decreased urine output.  No recent falls.  No recent medication changes.  He became confused yesterday.  No infectious symptoms no cough congestion fever vomiting or diarrhea.  No dark stool or blood in stool.  I tried to get him onto his wheelchair to take him to the car so they could take him to his oncologist appointment this morning and when they got him onto the chair he apparently stopped breathing.  Patient's lactate is 9. Also found to be anemic. Ordered blood transfusion. Code sepsis was initiated. Given 30 cc/kg of IV fluids. Patient has had some modest improvement of his blood pressure though still having maps below 65. Start the patient on Levophed. He has significant LFT elevation and total bili elevation. This is likely due to profound dehydration but will order a CT scan of the abdomen pelvis.  CT scan is without acute pathology.  He does have diffuse bony disease.  Mental status is significantly improved on reassessment.  Blood pressure is better but still on five mics of Levophed.  Discussed with critical care for evaluation.  CRITICAL CARE Performed by: Cecilio Asper   Total critical care time: 80 minutes  Critical care time was exclusive of separately billable procedures and treating other patients.  Critical care was necessary to treat or prevent imminent or life-threatening deterioration.  Critical care was time spent personally by me on the following activities: development of treatment plan with patient and/or surrogate as well as nursing, discussions with consultants, evaluation of patient's response to treatment, examination of patient, obtaining history from patient or surrogate, ordering and performing treatments and interventions, ordering and review of  laboratory studies, ordering and review of radiographic studies, pulse oximetry and re-evaluation of patient's condition.   Sepsis - Repeat Assessment  Performed at:    [id number]     Blood pressure 104/66, pulse (!) 102, temperature 97.7 F (36.5 C), temperature source Oral, resp. rate 14, SpO2 96 %.  Heart:     Tachycardic  Lungs:    CTA  Capillary Refill:   <2 sec  Peripheral Pulse:   Radial pulse palpable  Skin:     icteric     Final Clinical Impression(s) / ED Diagnoses Final diagnoses:  Septic shock ([place])  Acute cystitis without hematuria  Hypovolemia  Disorientation  AKI (acute kidney injury) ([place])  Trichomoniasis    Rx / DC Orders ED Discharge Orders    None       Deno Etienne, DO 05/18/19 1543

## 2019-05-18 NOTE — ED Notes (Signed)
Bair hugger placed on pt along with 2 liters O2 n/c  Pt remains confused pulling at lines and mask , family has been updated by Md and is the way to the ED

## 2019-05-18 NOTE — ED Notes (Signed)
Lab results was given to Nurse, May have to redraw labs.

## 2019-05-18 NOTE — Sepsis Progress Note (Signed)
Notified provider of need to order repeat lactic acid. ° °

## 2019-05-19 ENCOUNTER — Other Ambulatory Visit: Payer: Self-pay

## 2019-05-19 ENCOUNTER — Encounter (HOSPITAL_COMMUNITY): Payer: Self-pay | Admitting: Pulmonary Disease

## 2019-05-19 DIAGNOSIS — C61 Malignant neoplasm of prostate: Secondary | ICD-10-CM

## 2019-05-19 DIAGNOSIS — N3000 Acute cystitis without hematuria: Secondary | ICD-10-CM

## 2019-05-19 DIAGNOSIS — L8990 Pressure ulcer of unspecified site, unspecified stage: Secondary | ICD-10-CM | POA: Insufficient documentation

## 2019-05-19 DIAGNOSIS — E871 Hypo-osmolality and hyponatremia: Secondary | ICD-10-CM

## 2019-05-19 DIAGNOSIS — A419 Sepsis, unspecified organism: Principal | ICD-10-CM

## 2019-05-19 DIAGNOSIS — R7401 Elevation of levels of liver transaminase levels: Secondary | ICD-10-CM

## 2019-05-19 DIAGNOSIS — R6521 Severe sepsis with septic shock: Secondary | ICD-10-CM

## 2019-05-19 LAB — BPAM RBC
Blood Product Expiration Date: 202103032359
Blood Product Expiration Date: 202103032359
ISSUE DATE / TIME: 202102031227
ISSUE DATE / TIME: 202102031554
Unit Type and Rh: 7300
Unit Type and Rh: 7300

## 2019-05-19 LAB — COMPREHENSIVE METABOLIC PANEL
ALT: 64 U/L — ABNORMAL HIGH (ref 0–44)
ALT: 53 U/L — ABNORMAL HIGH (ref 0–44)
AST: 215 U/L — ABNORMAL HIGH (ref 15–41)
AST: 122 U/L — ABNORMAL HIGH (ref 15–41)
Albumin: 1.8 g/dL — ABNORMAL LOW (ref 3.5–5.0)
Albumin: 1.7 g/dL — ABNORMAL LOW (ref 3.5–5.0)
Alkaline Phosphatase: 2193 U/L — ABNORMAL HIGH (ref 38–126)
Alkaline Phosphatase: 2221 U/L — ABNORMAL HIGH (ref 38–126)
Anion gap: 13 (ref 5–15)
Anion gap: 15 (ref 5–15)
BUN: 37 mg/dL — ABNORMAL HIGH (ref 8–23)
BUN: 38 mg/dL — ABNORMAL HIGH (ref 8–23)
CO2: 13 mmol/L — ABNORMAL LOW (ref 22–32)
CO2: 12 mmol/L — ABNORMAL LOW (ref 22–32)
Calcium: 6.9 mg/dL — ABNORMAL LOW (ref 8.9–10.3)
Calcium: 6.9 mg/dL — ABNORMAL LOW (ref 8.9–10.3)
Chloride: 103 mmol/L (ref 98–111)
Chloride: 102 mmol/L (ref 98–111)
Creatinine, Ser: 2.76 mg/dL — ABNORMAL HIGH (ref 0.61–1.24)
Creatinine, Ser: 2.23 mg/dL — ABNORMAL HIGH (ref 0.61–1.24)
GFR calc Af Amer: 26 mL/min — ABNORMAL LOW (ref >60)
GFR calc Af Amer: 34 mL/min — ABNORMAL LOW (ref >60)
GFR calc non Af Amer: 23 mL/min — ABNORMAL LOW (ref >60)
GFR calc non Af Amer: 29 mL/min — ABNORMAL LOW (ref >60)
Glucose, Bld: 110 mg/dL — ABNORMAL HIGH (ref 70–99)
Glucose, Bld: 169 mg/dL — ABNORMAL HIGH (ref 70–99)
Potassium: 5.3 mmol/L — ABNORMAL HIGH (ref 3.5–5.1)
Potassium: 5 mmol/L (ref 3.5–5.1)
Sodium: 129 mmol/L — ABNORMAL LOW (ref 135–145)
Sodium: 129 mmol/L — ABNORMAL LOW (ref 135–145)
Total Bilirubin: 5.1 mg/dL — ABNORMAL HIGH (ref 0.3–1.2)
Total Bilirubin: 4.9 mg/dL — ABNORMAL HIGH (ref 0.3–1.2)
Total Protein: 4.2 g/dL — ABNORMAL LOW (ref 6.5–8.1)
Total Protein: 4.2 g/dL — ABNORMAL LOW (ref 6.5–8.1)

## 2019-05-19 LAB — CBC
HCT: 25.4 % — ABNORMAL LOW (ref 39.0–52.0)
Hemoglobin: 8.4 g/dL — ABNORMAL LOW (ref 13.0–17.0)
MCH: 27.5 pg (ref 26.0–34.0)
MCHC: 33.1 g/dL (ref 30.0–36.0)
MCV: 83.3 fL (ref 80.0–100.0)
Platelets: 17 10*3/uL — CL (ref 150–400)
RBC: 3.05 MIL/uL — ABNORMAL LOW (ref 4.22–5.81)
RDW: 17.7 % — ABNORMAL HIGH (ref 11.5–15.5)
WBC: 2.3 10*3/uL — ABNORMAL LOW (ref 4.0–10.5)
nRBC: 4.8 % — ABNORMAL HIGH (ref 0.0–0.2)

## 2019-05-19 LAB — TYPE AND SCREEN
ABO/RH(D): B POS
Antibody Screen: NEGATIVE
Unit division: 0
Unit division: 0

## 2019-05-19 LAB — URINE CULTURE: Culture: NO GROWTH

## 2019-05-19 LAB — RPR: RPR Ser Ql: NONREACTIVE

## 2019-05-19 MED ORDER — SODIUM CHLORIDE 0.9 % IV SOLN
1.0000 g | INTRAVENOUS | Status: DC
  Filled 2019-05-19: qty 10

## 2019-05-19 MED ORDER — METRONIDAZOLE 500 MG PO TABS
500.0000 mg | ORAL_TABLET | Freq: Two times a day (BID) | ORAL | Status: DC
  Administered 2019-05-19 – 2019-05-21 (×4): 500 mg via ORAL
  Filled 2019-05-19 (×5): qty 1

## 2019-05-19 MED ORDER — CEPHALEXIN 250 MG PO CAPS
250.0000 mg | ORAL_CAPSULE | Freq: Three times a day (TID) | ORAL | Status: DC
  Filled 2019-05-19 (×2): qty 1

## 2019-05-19 MED ORDER — METRONIDAZOLE IN NACL 5-0.79 MG/ML-% IV SOLN: 500.0000 mg | Freq: Two times a day (BID) | INTRAVENOUS | Status: DC

## 2019-05-19 MED ORDER — HYDROMORPHONE HCL 1 MG/ML IJ SOLN
0.5000 mg | INTRAMUSCULAR | Status: DC | PRN
  Administered 2019-05-19: 14:00:00 0.5 mg via INTRAVENOUS
  Filled 2019-05-19: qty 1

## 2019-05-19 MED ORDER — DEXAMETHASONE SODIUM PHOSPHATE 10 MG/ML IJ SOLN
8.0000 mg | Freq: Two times a day (BID) | INTRAMUSCULAR | Status: DC
  Administered 2019-05-19 – 2019-05-20 (×3): 8 mg via INTRAVENOUS
  Filled 2019-05-19 (×4): qty 0.8

## 2019-05-19 MED ORDER — CEPHALEXIN 250 MG PO CAPS
250.0000 mg | ORAL_CAPSULE | Freq: Two times a day (BID) | ORAL | Status: DC
  Administered 2019-05-19 – 2019-05-20 (×3): 250 mg via ORAL
  Filled 2019-05-19 (×3): qty 1

## 2019-05-19 NOTE — Plan of Care (Signed)
  Problem: Clinical Measurements: Goal: Respiratory complications will improve Outcome: Progressing   Problem: Coping: Goal: Level of anxiety will decrease Outcome: Progressing   Problem: Elimination: Goal: Will not experience complications related to urinary retention Outcome: Progressing   Problem: Safety: Goal: Ability to remain free from injury will improve Outcome: Progressing   Problem: Skin Integrity: Goal: Risk for impaired skin integrity will decrease Outcome: Progressing   

## 2019-05-19 NOTE — Progress Notes (Signed)
NAME:  Harry Dixon, MRN:  [id number], DOB:  [birth year], LOS: 1 ADMISSION DATE:  05/18/2019, CHIEF COMPLAINT:  hypotension  Brief History   40 yom w/ what appears to be progressive stage IV prostate CA (initially involving mostly spine and pelvis). Not on therapy since June 2020. Presents 2/3 w/ working dx of acute metabolic encephalopathy w/ septic shock felt 2/2 UT source but also dx eval suggesting marked progression of his metastatic disease now involving: retroperitoneal cavity, liver and brain.   Consults:  Palliative Care  Significant Diagnostic Tests:  UA with +WBC and trichomonas  Micro Data:  UA with +WBC, +bacteria and trichomonas  Antimicrobials:  Flagyl, ceftriaxone  Interim history/subjective:  Overnight norepinephrine discontinued. Never required central line or A-line. Hungry.   Objective   Blood pressure (!) 93/56, pulse 86, temperature 98.7 F (37.1 C), temperature source Oral, resp. rate 15, weight 77.3 kg, SpO2 100 %.        Intake/Output Summary (Last 24 hours) at 05/19/2019 1004 Last data filed at 05/19/2019 0800 Gross per 24 hour  Intake 3487.58 ml  Output 50 ml  Net 3437.58 ml   Filed Weights   05/19/19 0645  Weight: 77.3 kg    Examination: General: elderly man, no apparent distress HENT: mmm Lungs: ctab Cardiovascular: RRR, no mrg Abdomen: soft, nontender Extremities: no edema Neuro: normal speech, moves all 4 extremities   Assessment & Plan:  Harry Dixon is a 69 y.o. man with metastatic prostate cancer who presented with   Circulatory versus septic shock Metastatic prostate cancer with mets to the brain liver and retroperitoneal cavity Acute liver injury secondary to hepatic metastases Urinary tract infection Possible Trichomonas infection Hyponatremia  Treat trichomonas with flagyl twice daily.  I changes from IV 3 times daily to twice daily.  I doubt this represents a true trichomonas infection and is likely polymicrobial in the setting  of his prostate cancer, however we will treated to be in the safe side. Will narrow for UTI coverage with oral Keflex.  Off pressors.  Consult palliative care for hospice evaluation. Suspect he is decompensating from progressive cancer.  We will continue steroids for now for brain mets.  He has not been getting these at home.  He has been off all radiation and chemotherapy since June 2020.  Best practice:  Diet: Regular diet Pain/Anxiety/Delirium protocol (if indicated): Pain control DVT prophylaxis: SCDs Glucose control: Controlled Foley none Mobility: out Of bed with assist Code Status: DNR Disposition: He is stable for transfer to regular nursing floor.  [facility] to assume care 2/5   Labs   CBC: Recent Labs  Lab 05/18/19 0901 05/18/19 2140 05/19/19 0241  WBC 2.3*  --  2.3*  NEUTROABS 1.3*  --   --   HGB 6.1* 8.5* 8.4*  HCT 21.6* 26.1* 25.4*  MCV 88.5  --  83.3  PLT 16*  --  17*    Basic Metabolic Panel: Recent Labs  Lab 05/18/19 0901 05/18/19 2140 05/19/19 0241  NA 131* 133* 129*  K 5.5* 4.9 5.3*  CL 98 103 103  CO2 11* 14* 13*  GLUCOSE 87 109* 110*  BUN 40* 39* 37*  CREATININE 4.10* 3.00* 2.76*  CALCIUM 6.9* 6.6* 6.9*   GFR: CrCl cannot be calculated (Unknown ideal weight.). Recent Labs  Lab 05/18/19 0901 05/18/19 0902 05/18/19 1115 05/18/19 2140 05/19/19 0241  PROCALCITON 28.70  --   --   --   --   WBC 2.3*  --   --   --  2.3*  LATICACIDVEN  --  9.6* 6.8* 2.9*  --     Liver Function Tests: Recent Labs  Lab 05/18/19 0901 05/18/19 2140 05/19/19 0241  AST 314* 264* 215*  ALT 68* 66* 64*  ALKPHOS 2,719* 2,862* 2,193*  BILITOT 3.0* 4.4* 5.1*  PROT 4.3* 4.2* 4.2*  ALBUMIN 1.7* 1.7* 1.8*   No results for input(s): LIPASE, AMYLASE in the last 168 hours. Recent Labs  Lab 05/18/19 0926  AMMONIA 33    ABG No results found for: PHART, PCO2ART, PO2ART, HCO3, TCO2, ACIDBASEDEF, O2SAT   Coagulation Profile: No results for input(s): INR, PROTIME  in the last 168 hours.  Cardiac Enzymes: No results for input(s): CKTOTAL, CKMB, CKMBINDEX, TROPONINI in the last 168 hours.  HbA1C: No results found for: HGBA1C  CBG: Recent Labs  Lab 05/18/19 0917 05/18/19 1519 05/18/19 1938 05/18/19 2315  GLUCAP 76 106* 113* 96

## 2019-05-19 NOTE — Progress Notes (Signed)
Pt transferred to 5M22 via bed. Report called to Darylene Price, Therapist, sports. Pt's VS stable. No apparent distress.His daughter was notified of transfer and room number.

## 2019-05-20 DIAGNOSIS — N179 Acute kidney failure, unspecified: Secondary | ICD-10-CM

## 2019-05-20 DIAGNOSIS — Z7189 Other specified counseling: Secondary | ICD-10-CM

## 2019-05-20 DIAGNOSIS — Z515 Encounter for palliative care: Secondary | ICD-10-CM

## 2019-05-20 DIAGNOSIS — R4182 Altered mental status, unspecified: Secondary | ICD-10-CM

## 2019-05-20 DIAGNOSIS — Z66 Do not resuscitate: Secondary | ICD-10-CM

## 2019-05-20 LAB — PATHOLOGIST SMEAR REVIEW

## 2019-05-20 MED ORDER — CEPHALEXIN 500 MG PO CAPS
500.0000 mg | ORAL_CAPSULE | Freq: Two times a day (BID) | ORAL | Status: DC
  Administered 2019-05-20 – 2019-05-21 (×2): 500 mg via ORAL
  Filled 2019-05-20 (×3): qty 1

## 2019-05-20 MED ORDER — SODIUM CHLORIDE 0.9 % IV SOLN: 250.0000 mg | Freq: Two times a day (BID) | INTRAVENOUS | Status: DC

## 2019-05-20 MED ORDER — LEVETIRACETAM 250 MG PO TABS
250.0000 mg | ORAL_TABLET | Freq: Two times a day (BID) | ORAL | Status: DC
  Administered 2019-05-20 – 2019-05-21 (×3): 250 mg via ORAL
  Filled 2019-05-20 (×3): qty 1

## 2019-05-20 MED ORDER — LORAZEPAM 2 MG/ML IJ SOLN: 2.0000 mg | INTRAMUSCULAR | Status: DC | PRN

## 2019-05-20 MED ORDER — PANTOPRAZOLE SODIUM 40 MG PO TBEC
40.0000 mg | DELAYED_RELEASE_TABLET | Freq: Every day | ORAL | Status: DC
  Administered 2019-05-20 – 2019-05-21 (×2): 40 mg via ORAL
  Filled 2019-05-20 (×2): qty 1

## 2019-05-20 MED ORDER — DEXAMETHASONE 4 MG PO TABS
4.0000 mg | ORAL_TABLET | Freq: Two times a day (BID) | ORAL | Status: DC
  Administered 2019-05-20 – 2019-05-21 (×2): 4 mg via ORAL
  Filled 2019-05-20 (×3): qty 1

## 2019-05-20 NOTE — Consult Note (Signed)
Consultation Note Date: 05/20/2019   Patient Name: Harry Dixon  DOB: October 13, 1950  MRN: [id number]  Age / Sex: 69 y.o., male  PCP: Patient, No Pcp Per Referring Physician: Domenic Polite, MD  Reason for Consultation: Establishing goals of care, End stage prostate cancer  HPI/Patient Profile:  69 y.o. w/ what appears to be progressive stage IV prostate CA (initially involving mostly spine and pelvis). Not on therapy since June 2020. Presents 2/3 w/ working dx of acute metabolic encephalopathy w/ septic shock felt 2/2 UT source but also dx eval suggesting marked progression of his metastatic disease now involving: retroperitoneal cavity, liver and brain.  Palliative care consult requested due to metastatic prostate cancer. Presently deemed as end stage.   Clinical Assessment and Goals of Care: I have reviewed medical records including EPIC notes, labs and imaging, received report from bedside RN, assessed the patient.    I met with Harry Dixon and his daughter,  Harry Dixon at bedside to further discuss diagnosis prognosis, GOC, EOL wishes, disposition and options.   I introduced Palliative Medicine as specialized medical care for people living with serious illness. It focuses on providing relief from the symptoms and stress of a serious illness. The goal is to improve quality of life for both the patient and the family.  Harry Dixon said that she was aware we would be meeting. In preparation she had already spoken to her brothers and sisters. The patient himself knows that his cancer has spread to the point whereby treatments would be of little benefit. We talked about his love of his family. He has five children though his eldest son is deceased. He is worried about his childrens survival without him. Harry Dixon was able to reassure him that they will all be alright in his absence. Patient and family are experiencing anticipatory  grief presently.   We discussed in greater detail what hospice is. Endorsed that the goal would be a directed focus on symptom management. We talked about focusing on the quality of Harry Dixon's life at home and not how many days he has left. He was very tearful stating that he has fought this illness for the last seven years. He has given it all he has been able to. We discussed him being okay with this new reality.   Harry Dixon and I talked about next steps. She is hopeful to get him home as soon as able.   Harry Dixon was encouraged to bring in [facility] paperwork for our completion.  Patient is DNR/DNI, no escalation of care or aggressive interventions. Plan will be to transition home with hospice.   Discussed with patient the importance of continued conversation with family and their  medical providers regarding overall plan of care and treatment options, ensuring decisions are within the context of the patients values and GOCs.  Decision Maker: Harry Dixon [phone]   SUMMARY OF RECOMMENDATIONS   DNR/DNI  TOC for Hospice Consult  Chaplain consult  Code Status/Advance Care Planning:  DNR  Symptom Management:  Pain, d/t metastatic disease:  - Agree with  dexamethasone   - Dilaudid 0.5-'1mg'$  IVP for breakthrough pain can transition to PO formulation prior to discharge  Palliative Prophylaxis:   Aspiration, Bowel Regimen, Delirium Protocol, Eye Care, Frequent Pain Assessment, Oral Care, Palliative Wound Care and Turn Reposition  Additional Recommendations (Limitations, Scope, Preferences):  Avoid Hospitalization and Minimize Medications  Psycho-social/Spiritual:   Desire for further Chaplaincy support: YES  Additional Recommendations: Caregiving  Support/Resources and Education on Hospice  Prognosis:   < 6 months  Discharge Planning: Home with Hospice     Primary Diagnoses: Present on Admission: . Septic shock ([place])  I have reviewed the medical record, interviewed the  patient and family, and examined the patient. The following aspects are pertinent.  Past Medical History:  Diagnosis Date  . Prostate cancer [facility])    Social History   Socioeconomic History  . Marital status: Single    Spouse name: Not on file  . Number of children: Not on file  . Years of education: Not on file  . Highest education level: Not on file  Occupational History  . Not on file  Tobacco Use  . Smoking status: Never Smoker  . Smokeless tobacco: Never Used  Substance and Sexual Activity  . Alcohol use: Never  . Drug use: Not on file  . Sexual activity: Not on file  Other Topics Concern  . Not on file  Social History Narrative  . Not on file   Social Determinants of Health   Financial Resource Strain:   . Difficulty of Paying Living Expenses: Not on file  Food Insecurity:   . Worried About Charity fundraiser in the Last Year: Not on file  . Ran Out of Food in the Last Year: Not on file  Transportation Needs:   . Lack of Transportation (Medical): Not on file  . Lack of Transportation (Non-Medical): Not on file  Physical Activity:   . Days of Exercise per Week: Not on file  . Minutes of Exercise per Session: Not on file  Stress:   . Feeling of Stress : Not on file  Social Connections:   . Frequency of Communication with Friends and Family: Not on file  . Frequency of Social Gatherings with Friends and Family: Not on file  . Attends Religious Services: Not on file  . Active Member of Clubs or Organizations: Not on file  . Attends Archivist Meetings: Not on file  . Marital Status: Not on file   History reviewed. No pertinent family history. Scheduled Meds: . cephALEXin  250 mg Oral Q12H  . Chlorhexidine Gluconate Cloth  6 each Topical Daily  . dexamethasone (DECADRON) injection  8 mg Intravenous Q12H  . metroNIDAZOLE  500 mg Oral Q12H   Continuous Infusions: . sodium chloride 10 mL/hr at 05/19/19 2057  . sodium chloride 10 mL/hr at 05/19/19  0700   PRN Meds:.sodium chloride, HYDROmorphone (DILAUDID) injection Medications Prior to Admission:  Prior to Admission medications   Medication Sig Start Date End Date Taking? Authorizing Provider  LORazepam (ATIVAN) 0.5 MG tablet Take 0.5 mg by mouth every 8 (eight) hours as needed for anxiety.   Yes [provider]  naproxen sodium (ALEVE) 220 MG tablet Take 220 mg by mouth 3 (three) times daily.   Yes [provider]  ondansetron (ZOFRAN) 4 MG tablet Take 4 mg by mouth every 8 (eight) hours as needed for nausea or vomiting.   Yes [provider]  oxyCODONE (OXY IR/ROXICODONE) 5 MG immediate release tablet Take  5 mg by mouth 3 (three) times daily.    Yes [provider]  predniSONE (DELTASONE) 5 MG tablet Take 5 mg by mouth 2 (two) times daily with a meal.   Yes [provider]   Allergies  Allergen Reactions  . Methadone Nausea And Vomiting  . Metformin Nausea Only   Review of Systems  Constitutional: Positive for activity change and appetite change.   Physical Exam Vitals and nursing note reviewed.  HENT:     Head: Normocephalic.     Nose: Nose normal.     Mouth/Throat:     Mouth: Mucous membranes are dry.  Eyes:     Pupils: Pupils are equal, round, and reactive to light.  Cardiovascular:     Rate and Rhythm: Normal rate and regular rhythm.  Pulmonary:     Effort: Pulmonary effort is normal.  Abdominal:     Palpations: Abdomen is soft.  Musculoskeletal:     Cervical back: Normal range of motion.  Skin:    General: Skin is warm and dry.     Capillary Refill: Capillary refill takes less than 2 seconds.  Neurological:     Mental Status: He is alert and oriented to person, place, and time.    Vital Signs: BP 111/74 (BP Location: Right Arm)   Pulse 81   Temp 98 F (36.7 C) (Oral)   Resp 18   Ht '5\' 11"'$  (1.803 m)   Wt 77.3 kg   SpO2 100%   BMI 23.77 kg/m  Pain Scale: Faces   Pain Score: 0-No pain  SpO2: SpO2: 100  % O2 Device:SpO2: 100 % O2 Flow Rate: .O2 Flow Rate (L/min): 2 L/min  IO: Intake/output summary:   Intake/Output Summary (Last 24 hours) at 05/20/2019 0744 Last data filed at 05/20/2019 0600 Gross per 24 hour  Intake 1108.57 ml  Output 700 ml  Net 408.57 ml   LBM: Last BM Date: (PTA) Baseline Weight: Weight: 77.3 kg Most recent weight: Weight: 77.3 kg     Palliative Assessment/Data: 30%   Time In: 1200 Time Out: 1310 Time Total: 70 Greater than 50%  of this time was spent counseling and coordinating care related to the above assessment and plan.  Signed by: Rosezella Rumpf, NP   Please contact Palliative Medicine Team phone at [phone] for questions and concerns.  For individual provider: See Shea Evans

## 2019-05-20 NOTE — Progress Notes (Signed)
PROGRESS NOTE    Harry Dixon  [id number] DOB: 09-Apr-1951 DOA: 05/18/2019 PCP: Patient, No Pcp Per  Brief Narrative: 69 yom w/ what appears to be progressive stage IV prostate CA (initially involving mostly spine and pelvis). Not on therapy since June 2020. Presents 2/3 w/ working dx of acute metabolic encephalopathy w/ septic shock felt 2/2 UT source but also dx eval suggesting marked progression of his metastatic disease now involving: retroperitoneal cavity, liver and brain etc. -Treated with antibiotics, fluid resuscitation and pressors -Palliative medicine consulted for goals of care, consideration of hospice -Transferred from P[facility] to [facility] 2/5   Assessment & Plan:   Septic shock -Presumed to be secondary to UTI -Urine culture negative, blood cultures also negative -Treated with pressors, fluid resuscitation -IV fluids discontinued, continue Keflex  Widely metastatic prostate cancer Brain metastasis Extensive skeletal, liver mets -Prognosis appears poor, started on Decadron this admit, will change to p.o. -Followed by oncology at [facility], patient reports that hospice was recommended at last visit approximately a month ago -Palliative medicine consulted for goals of care  Severe protein calorie malnutrition  Ambulatory dysfunction -Wheelchair-bound at baseline  Abnormal EEG -likely due to brain mets -now on decadron   DVT prophylaxis: SCDs Code Status: DNR Family Communication: No family at bedside will update daughter Disposition Plan: Home tomorrow if stable  Consultants:   Palliative   Procedures:   Antimicrobials:    Subjective: -Complains of aches and pains all over,  Objective: Vitals:   05/19/19 2038 05/19/19 2245 05/20/19 0504 05/20/19 0852  BP: 105/67  111/74 101/64  Pulse: 100  81 81  Resp: 18  18 20   Temp: 98.6 F (37 C)  98 F (36.7 C) 98.2 F (36.8 C)  TempSrc: Oral  Oral Oral  SpO2: 94%  100% 100%  Weight:      Height:  5\' 11"   (1.803 m)      Intake/Output Summary (Last 24 hours) at 05/20/2019 1219 Last data filed at 05/20/2019 [id number] Gross per 24 hour  Intake 953.57 ml  Output 1000 ml  Net -46.43 ml   Filed Weights   05/19/19 0645  Weight: 77.3 kg    Examination:  General exam: Chronically ill male, appears much older than stated age, AAOx3 Respiratory system: Clear Cardiovascular system: S1 & S2 heard, RRR. Gastrointestinal system: Abdomen is nondistended, soft and nontender.Normal bowel sounds heard. Central nervous system: Alert and oriented, severe lower extremity weakness bilaterally Extremities: Chronic venous stasis changes, scaling, profound lower leg weakness Skin: No rashes Psychiatry:  Mood & affect appropriate.   Data Reviewed:   CBC: Recent Labs  Lab 05/18/19 0901 05/18/19 2140 05/19/19 0241  WBC 2.3*  --  2.3*  NEUTROABS 1.3*  --   --   HGB 6.1* 8.5* 8.4*  HCT 21.6* 26.1* 25.4*  MCV 88.5  --  83.3  PLT 16*  --  17*   Basic Metabolic Panel: Recent Labs  Lab 05/18/19 0901 05/18/19 2140 05/19/19 0241 05/19/19 1656  NA 131* 133* 129* 129*  K 5.5* 4.9 5.3* 5.0  CL 98 103 103 102  CO2 11* 14* 13* 12*  GLUCOSE 87 109* 110* 169*  BUN 40* 39* 37* 38*  CREATININE 4.10* 3.00* 2.76* 2.23*  CALCIUM 6.9* 6.6* 6.9* 6.9*   GFR: Estimated Creatinine Clearance: 34.2 mL/min (A) (by C-G formula based on SCr of 2.23 mg/dL (H)). Liver Function Tests: Recent Labs  Lab 05/18/19 0901 05/18/19 2140 05/19/19 0241 05/19/19 1656  AST 314* 264* 215* 122*  ALT 68* 66* 64* 53*  ALKPHOS 2,719* 2,862* 2,193* 2,221*  BILITOT 3.0* 4.4* 5.1* 4.9*  PROT 4.3* 4.2* 4.2* 4.2*  ALBUMIN 1.7* 1.7* 1.8* 1.7*   No results for input(s): LIPASE, AMYLASE in the last 168 hours. Recent Labs  Lab 05/18/19 0926  AMMONIA 33   Coagulation Profile: No results for input(s): INR, PROTIME in the last 168 hours. Cardiac Enzymes: No results for input(s): CKTOTAL, CKMB, CKMBINDEX, TROPONINI in the last 168  hours. BNP (last 3 results) No results for input(s): PROBNP in the last 8760 hours. HbA1C: No results for input(s): HGBA1C in the last 72 hours. CBG: Recent Labs  Lab 05/18/19 0917 05/18/19 1519 05/18/19 1938 05/18/19 2315  GLUCAP 76 106* 113* 96   Lipid Profile: No results for input(s): CHOL, HDL, LDLCALC, TRIG, CHOLHDL, LDLDIRECT in the last 72 hours. Thyroid Function Tests: Recent Labs    05/18/19 0926  TSH 3.980  FREET4 0.86   Anemia Panel: No results for input(s): VITAMINB12, FOLATE, FERRITIN, TIBC, IRON, RETICCTPCT in the last 72 hours. Urine analysis:    Component Value Date/Time   COLORURINE BROWN (A) 05/18/2019 0932   APPEARANCEUR CLOUDY (A) 05/18/2019 0932   LABSPEC >1.030 (H) 05/18/2019 0932   PHURINE 5.0 05/18/2019 0932   GLUCOSEU NEGATIVE 05/18/2019 0932   HGBUR LARGE (A) 05/18/2019 0932   BILIRUBINUR MODERATE (A) 05/18/2019 0932   KETONESUR 15 (A) 05/18/2019 0932   PROTEINUR 100 (A) 05/18/2019 0932   NITRITE POSITIVE (A) 05/18/2019 0932   LEUKOCYTESUR MODERATE (A) 05/18/2019 0932   Sepsis Labs: @LABRCNTIP (procalcitonin:4,lacticidven:4)  ) Recent Results (from the past 240 hour(s))  Blood Cultures (routine x 2)     Status: None (Preliminary result)   Collection Time: 05/18/19  9:18 AM   Specimen: BLOOD  Result Value Ref Range Status   Specimen Description BLOOD RIGHT ANTECUBITAL  Final   Special Requests   Final    BOTTLES DRAWN AEROBIC AND ANAEROBIC Blood Culture results may not be optimal due to an inadequate volume of blood received in culture bottles   Culture   Final    NO GROWTH 1 DAY Performed at [facility], [place] [address], [place], [place] [address]    Report Status PENDING  Incomplete  Blood Cultures (routine x 2)     Status: None (Preliminary result)   Collection Time: 05/18/19  9:26 AM   Specimen: BLOOD  Result Value Ref Range Status   Specimen Description BLOOD LEFT ANTECUBITAL  Final   Special Requests   Final    BOTTLES  DRAWN AEROBIC AND ANAEROBIC Blood Culture adequate volume   Culture   Final    NO GROWTH 1 DAY Performed at [facility], [place] [address], [place], [place] [address]    Report Status PENDING  Incomplete  Urine culture     Status: None   Collection Time: 05/18/19  9:32 AM   Specimen: Urine, Random  Result Value Ref Range Status   Specimen Description URINE, RANDOM  Final   Special Requests NONE  Final   Culture   Final    NO GROWTH Performed at [facility], [place] [address], [place], [place] [address]    Report Status 05/19/2019 FINAL  Final  Respiratory Panel by RT PCR (Flu A&B, Covid) - Nasopharyngeal Swab     Status: None   Collection Time: 05/18/19 12:48 PM   Specimen: Nasopharyngeal Swab  Result Value Ref Range Status   SARS Coronavirus 2 by RT PCR  NEGATIVE NEGATIVE Final    Comment: (NOTE) SARS-CoV-2 target nucleic acids are NOT DETECTED. The SARS-CoV-2 RNA is generally detectable in upper respiratoy specimens during the acute phase of infection. The lowest concentration of SARS-CoV-2 viral copies this assay can detect is 131 copies/mL. A negative result does not preclude SARS-Cov-2 infection and should not be used as the sole basis for treatment or other patient management decisions. A negative result may occur with  improper specimen collection/handling, submission of specimen other than nasopharyngeal swab, presence of viral mutation(s) within the areas targeted by this assay, and inadequate number of viral copies (<131 copies/mL). A negative result must be combined with clinical observations, patient history, and epidemiological information. The expected result is Negative. Fact Sheet for Patients:  PinkCheek.be Fact Sheet for Healthcare Providers:  GravelBags.it This test is not yet ap proved or cleared by the [place] FDA and  has been authorized for detection and/or diagnosis of SARS-CoV-2  by FDA under an Emergency Use Authorization (EUA). This EUA will remain  in effect (meaning this test can be used) for the duration of the COVID-19 declaration under Section 564(b)(1) of the Act, 21 U.S.C. section 360bbb-3(b)(1), unless the authorization is terminated or revoked sooner.    Influenza A by PCR NEGATIVE NEGATIVE Final   Influenza B by PCR NEGATIVE NEGATIVE Final    Comment: (NOTE) The Xpert Xpress SARS-CoV-2/FLU/RSV assay is intended as an aid in  the diagnosis of influenza from Nasopharyngeal swab specimens and  should not be used as a sole basis for treatment. Nasal washings and  aspirates are unacceptable for Xpert Xpress SARS-CoV-2/FLU/RSV  testing. Fact Sheet for Patients: PinkCheek.be Fact Sheet for Healthcare Providers: GravelBags.it This test is not yet approved or cleared by the [place] FDA and  has been authorized for detection and/or diagnosis of SARS-CoV-2 by  FDA under an Emergency Use Authorization (EUA). This EUA will remain  in effect (meaning this test can be used) for the duration of the  Covid-19 declaration under Section 564(b)(1) of the Act, 21  U.S.C. section 360bbb-3(b)(1), unless the authorization is  terminated or revoked. Performed at [facility], [place] [address], [place], [place] [address]   MRSA PCR Screening     Status: None   Collection Time: 2019-06-01  3:21 PM   Specimen: Nasopharyngeal  Result Value Ref Range Status   MRSA by PCR NEGATIVE NEGATIVE Final    Comment:        The GeneXpert MRSA Assay (FDA approved for NASAL specimens only), is one component of a comprehensive MRSA colonization surveillance program. It is not intended to diagnose MRSA infection nor to guide or monitor treatment for MRSA infections. Performed at [facility], [place] [address] [place], [place] [address]          Radiology Studies: US RENAL  Result Date:  2019/06/01 CLINICAL DATA:  Acute renal failure.  History of prostate cancer. EXAM: RENAL / URINARY TRACT ULTRASOUND COMPLETE COMPARISON:  CT scan of the abdomen dated 06/01/19 FINDINGS: Right Kidney: Renal measurements: 10.1 x 4.3 x 5.7 cm = volume: 130 mL . Echogenicity within normal limits. No mass or hydronephrosis visualized. Left Kidney: Renal measurements: 10.1 x 5.6 x 6.4 cm = volume: 189 mL. Echogenicity within normal limits. No mass or hydronephrosis visualized. Bladder: Appears normal for degree of bladder distention. Other: There is diffuse thickening of the bladder wall to 7 mm, as demonstrated on the CT scan. Ultrasound also demonstrates multiple hypoechoic but non cystic  lesions in the liver which are worrisome for metastases. IMPRESSION: 1. Normal appearing kidneys. 2. Thickening of the bladder wall, nonspecific. This may be related to the patient's prostate cancer if it previously created bladder outlet obstruction. 3. Incidental note is made of hypoechoic lesions in the liver worrisome for metastatic disease. Electronically Signed   By: Lorriane Shire M.D.   On: 05/18/2019 16:18   EEG adult  Result Date: 05/18/2019 Lora Havens, MD     05/19/2019  8:24 AM Patient Name: Kramer Dejesus MRN: [id number] Epilepsy Attending: Lora Havens Referring Physician/Provider: Salvadore Dom, NP Date: 05/18/2019 Duration: 23.56 mins Patient history: 69 year old male with stage IV prostatic cancer, brain mets who presented with altered mental status.  EEG to evaluate for seizures. Level of alertness: awake AEDs during EEG study: None Technical aspects: This EEG study was done with scalp electrodes positioned according to the 10-20 International system of electrode placement. Electrical activity was acquired at a sampling rate of 500Hz  and reviewed with a high frequency filter of 70Hz  and a low frequency filter of 1Hz . EEG data were recorded continuously and digitally stored. Description: During awake state, no  clear posterior dominant rhythm was seen.  EEG showed spikes at 1 Hz arising from right frontal-anterior temporal region (maximal F8).  EEG also showed continuous generalized and lateralized right hemisphere 3 to 6 Hz theta-delta slowing. Hyperventilation and photic stimulation were not performed due to AMS. 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  Additionally there was evidence of cortical dysfunction right hemisphere likely secondary to underlying metastatic disease. No seizures were seen throughout the recording. Priyanka [name]        Scheduled Meds: . cephALEXin  500 mg Oral Q12H  . Chlorhexidine Gluconate Cloth  6 each Topical Daily  . dexamethasone (DECADRON) injection  8 mg Intravenous Q12H  . metroNIDAZOLE  500 mg Oral Q12H   Continuous Infusions: . sodium chloride 10 mL/hr at 05/19/19 2057  . sodium chloride 10 mL/hr at 05/19/19 0700     LOS: 2 days    Time spent: 70min    Domenic Polite, MD [facility] Page via [email or web address], password [email or web address] After 7PM please contact night-coverage  05/20/2019, 12:19 PM

## 2019-05-20 NOTE — Consult Note (Addendum)
Neurology Consultation Reason for Consult: Abnormal EEG Referring Physician: Dr. Minus Breeding  CC: Altered mental status  History is obtained from: Chart review  HPI: Harry Dixon is a 69 y.o. 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  Per review of HPI, on the morning of 05/18/2019, patient appeared confused.  He then had a syncopal event.  EMS was called and patient was brought to [facility] ED.  On arrival, he was afebrile, noted to have a UTI, + trichomonas.  CT head was done which showed diffuse skull base infiltration and evidence of multifocal right hemispheric brain mets with mild to moderate vasogenic edema.  He was started on antibiotics, IV Decadron and EEG was ordered for further work-up of altered mental status.  EEG showed epileptogeniity arising from right frontal-anterior temporal region.  Therefore neurology was consulted for guidance on antiepileptic management.  On speaking with patient, he states he is in the hospital because he has cancer.  Denies any prior history of seizures.  Patient's daughter Tonette Lederer was at bedside later in the day.  She also denies any history of generalized tonic-clonic seizures, focal arm jerking, other seizure-like episodes.  ROS:  Unable to obtain due to altered mental status.   Past Medical History:  Diagnosis Date  . Prostate cancer [facility])    History reviewed.  Unable to obtain as patient is altered  Social History: Per chart review reports that he has never smoked. He has never used smokeless tobacco. He reports that he does not drink alcohol. No history on file for drug.   Exam: Current vital signs: BP 101/64 (BP Location: Right Arm)   Pulse 81   Temp 98.2 F (36.8 C) (Oral)   Resp 20   Ht 5' 11" (1.803 m)   Wt 77.3 kg   SpO2 100%   BMI 23.77 kg/m   Vital signs in last 24 hours: Temp:  [98 F (36.7 C)-99 F (37.2 C)] 98.2 F (36.8 C) (02/05 0852) Pulse Rate:  [81-102] 81 (02/05 0852) Resp:  [10-20] 20 (02/05 0852) BP: (87-112)/(63-75) 101/64 (02/05 0852) SpO2:  [94 %-100 %] 100 % (02/05 0852)   Physical Exam  Constitutional: Appears well-developed and well-nourished.  Psych: Affect appropriate to situation Eyes: No scleral injection HENT: No OP obstrucion Head: Normocephalic.  Cardiovascular: Normal rate and regular rhythm.  Respiratory: Effort normal, non-labored breathing GI: Soft.  No distension. There is no tenderness.  Skin: warm  Neuro: Mental Status: Patient is awake, alert, oriented to time place and able to follow commands Cranial Nerves: Cranial nerves II through XII grossly intact Motor: 4/5 in bilateral upper extremities, 3/5 in bilateral lower extremities   I have reviewed labs in epic and the results pertinent to this consultation are: 05/18/2019: WBC 2.3, hemoglobin 6.1, platelets 16 05/19/2019: Sodium 129, blood glucose 169, BUN 38, creatinine 2.23, AST 122, ALT 53, alk phos 2221, total bilirubin 4.9 Urine analysis on 05/18/2019: Positive nitrites, 21-50 WBC, many bacteria positive trichomonas Urine culture 03/17/2020: No growth Normal TSH, normal ammonia Blood cultures: No growth on day 1.  I have reviewed the images obtained: CT without contrast 03/16/2020:1. Diffuse skull base infiltration by tumor/metastasis, and evidence of multifocal right hemisphere brain metastases with mild-to-moderate vasogenic edema. 2. No significant intracranial mass effect.  ASSESSMENT/PLAN: 69 year old male with advanced metastatic prostate cancer with mets to  brain who presented with altered mental status and was found to have UTI, + trichomonas.  Neurology was consulted due to abnormal EEG.  Acute encephalopathy, multifactorial (infectious, toxic-metabolic, secondary to brain mets) Hyponatremia UTI,  Trichomonas Pancytopenia Prostate cancer with metastatic disease to brain Transaminitis Hyperbilirubinemia Acute renal failure -Patient denies any prior history of seizures.  Given abnormal EEG, there is higher likelihood that patient may have a seizure in future.  However, he is going home with hospice and the goal is to keep him comfortable.   -I discussed with patient's daughter the potential risks and benefits of being on antiepileptic therapy.  Patient and daughter agreed that they would like to hold off starting any antiseizure medications unless he has a clinical seizure. -I then discussed the option of rescue medication like intranasal Versed or rectal diazepam.  Patient's daughter agrees that she would prefer to go home on a rescue medication.    Recommendations -After discussing with patient's daughter the risks and benefits of starting antiepileptic regimen, we agreed to start patient on Keppra 250 mg twice daily.  Patient becomes excessively sedated or has any other significant adverse effects, we will consider switching to another AED.  -Ideally we prefer intranasal midazolam 10 mg (5 mg in each nostril) for seizure lasting more than 5 minutes due to the ease of administration.  Please request social worker to check the cost of intranasal Versed. -If intranasal Versed is cost prohibitive, patient can be discharged on PRN rectal diazepam 59m once for seizure lasting more than 5 minutes.  -Defer further imaging/MRI brain as it will not change management with the goal of home with hospice -Okay to continue dexamethasone 4 mg every 12 hours for cerebral edema.  Will defer further management of steroid dosing to patient's oncologist. -I discussed all seizure precautions with patient's daughter at bedside. -As needed IV Ativan 2 mg for clinical seizure activity while inpatient  Thank you for allowing [facility] participate in the care of this patient.  Neurology will sign off.  Priyanka OBarbra Sarks

## 2019-05-20 NOTE — TOC Initial Note (Signed)
Transition of Care [facility]) - Initial/Assessment Note    Patient Details  Name: Renay Langenberg MRN: [id number] Date of Birth: [birth year]  [facility] CM/SW Contact:    Bartholomew Crews, RN Phone Number: [phone] 05/20/2019, 12:55 PM  Clinical Narrative:                 Spoke with patient and daughter at the bedside. Discussed hospice consult. Offered choice for agencies. Referral placed to [facility]. Patient has most needed DME in place already, but could benefit from 3N1. Patient will need PTAR for transport. TOC team following.   Expected Discharge Plan: Home w Hospice Care Barriers to Discharge: Continued Medical Work up   Patient Goals and CMS Choice Patient states their goals for this hospitalization and ongoing recovery are:: home with hospice care CMS Medicare.gov Compare Post Acute Care list provided to:: Patient Choice offered to / list presented to : Adult Children, Patient  Expected Discharge Plan and Services Expected Discharge Plan: Home w Hospice Care In-house Referral: Hospice / Palliative Care Discharge Planning Services: CM Consult Post Acute Care Choice: Hospice Living arrangements for the past 2 months: Apartment                 DME Arranged: N/A DME Agency: NA       HH Arranged: NA HH Agency: NA        Prior Living Arrangements/Services Living arrangements for the past 2 months: Apartment Lives with:: Self(has 24 hour supervision) Patient language and need for interpreter reviewed:: Yes        Need for Family Participation in Patient Care: Yes (Comment) Care giver support system in place?: Yes (comment) Current home services: DME Criminal Activity/Legal Involvement Pertinent to Current Situation/Hospitalization: No - Comment as needed  Activities of Daily Living Home Assistive Devices/Equipment: Wheelchair ADL Screening (condition at time of admission) Patient's cognitive ability adequate to safely complete daily activities?:  Yes Is the patient deaf or have difficulty hearing?: No Does the patient have difficulty seeing, even when wearing glasses/contacts?: No Does the patient have difficulty concentrating, remembering, or making decisions?: No Patient able to express need for assistance with ADLs?: Yes Does the patient have difficulty dressing or bathing?: Yes Independently performs ADLs?: No Bathing: Needs assistance Is this a change from baseline?: Pre-admission baseline Toileting: Needs assistance Is this a change from baseline?: Pre-admission baseline In/Out Bed: Needs assistance Is this a change from baseline?: Pre-admission baseline Does the patient have difficulty walking or climbing stairs?: Yes Weakness of Legs: Both Weakness of Arms/Hands: None  Permission Sought/Granted Permission sought to share information with : Family Supports Permission granted to share information with : Yes, Verbal Permission Granted  Share Information with NAME: Kalix Schey     Permission granted to share info w Relationship: daughter  Permission granted to share info w Contact Information: [phone]  Emotional Assessment Appearance:: Appears stated age Attitude/Demeanor/Rapport: Engaged Affect (typically observed): Accepting Orientation: : Oriented to Self, Oriented to Place Alcohol / Substance Use: Not Applicable Psych Involvement: No (comment)  Admission diagnosis:  Hypovolemia [E86.1] Acute renal failure (HCC) [N17.9] Disorientation [R41.0] Acute cystitis without hematuria [N30.00] AKI (acute kidney injury) ([place]) [N17.9] Trichomoniasis [A59.9] Septic shock ([place]) [A41.9, R65.21] Patient Active Problem List   Diagnosis Date Noted  . Pressure injury of skin 05/19/2019  . Septic shock ([place]) 05/18/2019   PCP:  Patient, No Pcp Per Pharmacy:   [facility], [place]  [address] Phone: [phone] Fax: [phone]  [id number] -  [place], [place] [address] [place] [address] Phone: [phone] Fax: [phone]     Social Determinants of Health ([place]) Interventions    Readmission Risk Interventions No flowsheet data found.

## 2019-05-20 NOTE — Plan of Care (Signed)
  Problem: Education: Goal: Knowledge of General Education information will improve Description Including pain rating scale, medication(s)/side effects and non-pharmacologic comfort measures Outcome: Progressing   

## 2019-05-21 DIAGNOSIS — C61 Malignant neoplasm of prostate: Secondary | ICD-10-CM | POA: Diagnosis present

## 2019-05-21 DIAGNOSIS — Z7189 Other specified counseling: Secondary | ICD-10-CM

## 2019-05-21 DIAGNOSIS — N3000 Acute cystitis without hematuria: Secondary | ICD-10-CM

## 2019-05-21 MED ORDER — CEPHALEXIN 250 MG PO CAPS: 500.0000 mg | ORAL_CAPSULE | Freq: Two times a day (BID) | ORAL | 0 refills | Status: AC

## 2019-05-21 MED ORDER — METRONIDAZOLE 500 MG PO TABS: 500.0000 mg | ORAL_TABLET | Freq: Two times a day (BID) | ORAL | 0 refills | Status: DC

## 2019-05-21 MED ORDER — LEVETIRACETAM 250 MG PO TABS: 250.0000 mg | ORAL_TABLET | Freq: Two times a day (BID) | ORAL | 1 refills | Status: AC

## 2019-05-21 MED ORDER — DEXAMETHASONE 4 MG PO TABS: 4.0000 mg | ORAL_TABLET | Freq: Two times a day (BID) | ORAL | 0 refills | Status: AC

## 2019-05-21 MED ORDER — PANTOPRAZOLE SODIUM 40 MG PO TBEC: 40.0000 mg | DELAYED_RELEASE_TABLET | Freq: Every day | ORAL | 0 refills | Status: AC

## 2019-05-21 NOTE — Plan of Care (Signed)
  Problem: Clinical Measurements: Goal: Ability to maintain clinical measurements within normal limits will improve Outcome: Progressing   

## 2019-05-21 NOTE — Progress Notes (Signed)
   Palliative Medicine Inpatient Follow Up Note   HPI: 69 y.o. w/ what appears to be progressive stage IV prostate CA (initially involving mostly spine and pelvis). Not on therapy since June 2020. Presents 2/3 w/ working dx of acute metabolic encephalopathy w/ septic shock felt 2/2 UT source but also dx eval suggesting marked progression of his metastatic disease now involving: retroperitoneal cavity, liver and brain.  Palliative care consult requested due to metastatic prostate cancer. Presently deemed as end stage.   On 2/5 discussed with patient and his daughter transition home with hospice which they both agreed to.  Today's Discussion (05/21/2019): Chart reviewed. Evaluated patient in afternoon. Plan for him to discharge to home today with [facility] hospice. Patient and his daughter Tonette Lederer felt comfortable with the plan. Chantie asked how Mr. [place] can receive his covid vaccination, I endorsed that I would have to defer to the hospice agency as I'm not sure how this is being rolled out presently. She was thankful for all for the medical providers help during this time.   Vital Signs Vitals:   05/20/19 2051 05/21/19 0442  BP: 99/80 100/63  Pulse: 99 97  Resp: 18 18  Temp: 98.7 F (37.1 C) 97.8 F (36.6 C)  SpO2: 98% 99%    Intake/Output Summary (Last 24 hours) at 05/21/2019 1321 Last data filed at 05/21/2019 1251 Gross per 24 hour  Intake 639.25 ml  Output 900 ml  Net -260.75 ml   Last Weight  Most recent update: 05/19/2019  6:57 AM   Weight  77.3 kg (170 lb 6.7 oz)           Physical Exam Vitals and nursing note reviewed.  HENT:     Head: Normocephalic.     Nose: Nose normal.     Mouth/Throat:     Mouth: Mucous membranes are dry.  Eyes:     Pupils: Pupils are equal, round, and reactive to light.  Cardiovascular:     Rate and Rhythm: Normal rate and regular rhythm.  Pulmonary:     Effort: Pulmonary effort is normal.  Abdominal:     Palpations: Abdomen is soft.    Musculoskeletal:     Cervical back: Normal range of motion.  Skin:    General: Skin is warm and dry.     Capillary Refill: Capillary refill takes less than 2 seconds.  Neurological:     Mental Status: He is alert and oriented to person, place, and time.   SUMMARY OF RECOMMENDATIONS   DNR/DNI  Transition home with [facility] this afternoon  Time Spent: 15 Greater than 50% of the time was spent in counseling and coordination of care ______________________________________________________________________________________ [place] Team Team Cell Phone: [phone] Please utilize secure chat with additional questions, if there is no response within 30 minutes please call the above phone number  Palliative Medicine Team providers are available by phone from 7am to 7pm daily and can be reached through the team cell phone.  Should this patient require assistance outside of these hours, please call the patient's attending physician.

## 2019-05-21 NOTE — Progress Notes (Signed)
Harry Dixon to be discharged home with hospice per MD order. Discussed prescriptions and follow up appointments with the patient. Prescriptions given to patient; medication list explained in detail. Patient verbalized understanding.  Skin clean, dry and intact without evidence of skin break down, no evidence of skin tears noted. IV catheter discontinued intact. Site without signs and symptoms of complications. Dressing and pressure applied. Pt denies pain at the site currently. No complaints noted.  Patient free of lines, drains, and wounds.   An After Visit Summary (AVS) was printed and given to the patient. Patient escorted via PTAR, and discharged home via [place].  Baldo Ash, RN

## 2019-05-21 NOTE — Plan of Care (Signed)
  Problem: Education: Goal: Knowledge of General Education information will improve Description Including pain rating scale, medication(s)/side effects and non-pharmacologic comfort measures Outcome: Progressing   

## 2019-05-21 NOTE — Discharge Summary (Signed)
Physician Discharge Summary  Harry Dixon [id number] DOB: 02/15/1951 DOA: 05/18/2019  PCP: Patient, No Pcp Per  Admit date: 05/18/2019 Discharge date: 05/21/2019  Time spent: 35 minutes  Recommendations for Outpatient Follow-up:  1. 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  Was admitted to the ICU on 2/3 with acute metabolic encephalopathy w/ septic shock felt 2/2 UT source but also dx eval suggesting marked progression of his metastatic disease now involving: retroperitoneal cavity, liver and brain etc. -Treated with antibiotics, fluid resuscitation and pressors -Palliative medicine consulted for goals of care, consideration of hospice -He was transferred from critical care service to [facility] service yesterday 2/5  Hospital Course:   Septic shock -Presumed to be secondary to UTI versus prostrate -Urine culture negative, blood cultures also negative -Treated with pressors, fluid resuscitation -IV fluids discontinued, continue Keflex for 4 more days -Stable from an infection standpoint  Widely metastatic prostate cancer Brain metastasis Extensive skeletal, liver metastasis -Prognosis is poor, started on Decadron this admission -Followed by oncology at [facility], patient  reports that hospice was recommended at last visit approximately a month ago -Palliative medicine consulted for goals of care, status post family meeting yesterday plan for discharge home with hospice services, case management set up hospice for him  Severe protein calorie malnutrition  Ambulatory dysfunction -Wheelchair-bound at baseline  Abnormal EEG -likely due to brain metastasis -now on decadron, neurology consulted also recommended low-dose Keppra, he is discharged home on this -No seizures noted   Discharge Exam: Vitals:   05/20/19 2051 05/21/19 0442  BP: 99/80 100/63  Pulse: 99 97  Resp: 18 18  Temp: 98.7 F (37.1 C) 97.8 F (36.6 C)  SpO2: 98% 99%    General: AAOx3 Cardiovascular: S1S2/RRR Respiratory: Diminished breath sounds  Discharge Instructions   Discharge Instructions    Diet general   Complete by: As directed    Increase activity slowly   Complete by: As directed      Allergies as of 05/21/2019      Reactions   Methadone Nausea And Vomiting   Metformin Nausea Only      Medication List    STOP taking these medications   naproxen sodium 220 MG tablet Commonly known as: ALEVE   predniSONE 5 MG tablet Commonly known as: DELTASONE     TAKE these medications   cephALEXin 250 MG capsule Commonly known as: KEFLEX Take 2 capsules (500 mg total) by mouth 2 (two) times daily for 4 days. continue antibiotics through 05/24/19   dexamethasone 4 MG tablet Commonly known as: DECADRON Take 1 tablet (4 mg total) by mouth every 12 (twelve) hours.   levETIRAcetam 250 MG tablet Commonly known as: KEPPRA Take 1 tablet (250 mg total) by mouth 2 (two) times daily.   LORazepam 0.5 MG tablet Commonly known as: ATIVAN Take 0.5 mg by  mouth every 8 (eight) hours as needed for anxiety.   ondansetron 4 MG tablet Commonly known as: ZOFRAN Take 4 mg by mouth every 8 (eight) hours as needed for nausea or vomiting.   oxyCODONE 5 MG immediate release  tablet Commonly known as: Oxy IR/ROXICODONE Take 5 mg by mouth 3 (three) times daily.   pantoprazole 40 MG tablet Commonly known as: PROTONIX Take 1 tablet (40 mg total) by mouth daily at 12 noon.      Allergies  Allergen Reactions  . Methadone Nausea And Vomiting  . Metformin Nausea Only      The results of significant diagnostics from this hospitalization (including imaging, microbiology, ancillary and laboratory) are listed below for reference.    Significant Diagnostic Studies: CT ABDOMEN PELVIS WO CONTRAST  Result Date: 05/18/2019 CLINICAL DATA:  Abdominal pain, history of prostate cancer EXAM: CT ABDOMEN AND PELVIS WITHOUT CONTRAST TECHNIQUE: Multidetector CT imaging of the abdomen and pelvis was performed following the standard protocol without IV contrast. COMPARISON:  None. FINDINGS: Lower chest: There are small bilateral pleural effusions. No acute parenchymal lung disease. Incidental bilateral gynecomastia. Hepatobiliary: Too numerous to count hypodense masses throughout the liver compatible with diffuse hepatic metastases. Largest lesion within the inferior right lobe measuring approximately 4.5 cm reference image 33 of series 2. Gallbladder is unremarkable. No biliary dilatation. Pancreas: Pancreas is unremarkable. Spleen: Spleen is normal in appearance and size. Adrenals/Urinary Tract: No evidence of urinary tract calculi or obstructive uropathy. Kidneys and adrenal glands are grossly unremarkable. Diffuse bladder wall thickening may reflect chronic bladder outlet obstruction. Stomach/Bowel: No evidence of bowel obstruction or ileus. Normal appendix right lower quadrant. Vascular/Lymphatic: There is extensive retroperitoneal lymphadenopathy. Index left para-aortic lymph node reference image 36 series 2 measures 18 x 22 mm. Lymphadenopathy extends to the pelvis, with largest left external iliac chain lymph node measuring 26 x 11 mm reference image 76 of series 2. No evidence of  aortic aneurysm. Mild atherosclerosis of the distal aorta and its branches. Reproductive: Fiduciary markers are seen within the prostate. Prostate measures 2.8 x 3.5 by 3.1 cm. Other: No abdominal wall hernia.  No free fluid. Musculoskeletal: There is extensive diffuse bony metastatic disease involving all visualized bony structures. No evidence of pathologic fracture. Reconstructed images demonstrate no additional findings. IMPRESSION: 1. 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 Metastatic retroperitoneal lymphadenopathy. Diffuse hepatic metastases, and diffuse skeletal metastases as above. 2. Trace bilateral pleural effusions. Electronically Signed   By: Randa Ngo M.D.   On: 05/18/2019 12:26   CT HEAD WO CONTRAST  Result Date: 05/18/2019 CLINICAL DATA:  69 year old male with encephalopathy. "Unspecified cancer". EXAM: CT HEAD WITHOUT CONTRAST TECHNIQUE: Contiguous axial images were obtained from the base of the skull through the vertex without intravenous contrast. COMPARISON:  None. FINDINGS: Brain: Hypodensity in the right inferior frontal gyrus most suggestive of mass related vasogenic edema (series 2, image 14). Mild regional mass effect including on the right frontal horn. Additional edema suspected in the right middle frontal gyrus on coronal image 20. Additionally, there is probable edema in the anterior right temporal lobe best seen on sagittal image 20. But no left hemisphere or posterior fossa vasogenic edema identified. No associated intracranial hemorrhage. No midline shift or loss of basilar cisterns. No ventriculomegaly. No cortically based acute infarct identified. Vascular: Calcified atherosclerosis at the skull base. No suspicious intracranial vascular hyperdensity. Skull: Permeative, moth eaten appearance of bone throughout the skull base, including the central skull base, visible mandible, zygoma and maxilla.  Smaller lytic lesions are scattered in the calvarium  mostly along the coronal sutures. No definite extraosseous extension of tumor is identified by plain CT. Sinuses/Orbits: Abnormal sphenoid and right posterior ethmoid sinus opacification in conjunction with the highly abnormal appearance of the skull base. Similar partially opacified bilateral mastoid air cells and petrous apex air cells. The right tympanic cavity is partially opacified. Other: No orbit or scalp soft tissue mass identified. IMPRESSION: 1. Diffuse skull base infiltration by tumor/metastasis, and evidence of multifocal right hemisphere brain metastases with mild-to-moderate vasogenic edema. 2. No significant intracranial mass effect. Electronically Signed   By: Genevie Ann M.D.   On: 05/18/2019 12:34   US RENAL  Result Date: 05/18/2019 CLINICAL DATA:  Acute renal failure.  History of prostate cancer. EXAM: RENAL / URINARY TRACT ULTRASOUND COMPLETE COMPARISON:  CT scan of the abdomen dated 05/18/2019 FINDINGS: Right Kidney: Renal measurements: 10.1 x 4.3 x 5.7 cm = volume: 130 mL . Echogenicity within normal limits. No mass or hydronephrosis visualized. Left Kidney: Renal measurements: 10.1 x 5.6 x 6.4 cm = volume: 189 mL. Echogenicity within normal limits. No mass or hydronephrosis visualized. Bladder: Appears normal for degree of bladder distention. Other: There is diffuse thickening of the bladder wall to 7 mm, as demonstrated on the CT scan. Ultrasound also demonstrates multiple hypoechoic but non cystic lesions in the liver which are worrisome for metastases. IMPRESSION: 1. Normal appearing kidneys. 2. Thickening of the bladder wall, nonspecific. This may be related to the patient's prostate cancer if it previously created bladder outlet obstruction. 3. Incidental note is made of hypoechoic lesions in the liver worrisome for metastatic disease. Electronically Signed   By: Lorriane Shire M.D.   On: 05/18/2019 16:18   DG Chest Port 1 View  Result Date: 05/18/2019 CLINICAL DATA:  Altered mental  status, history of unspecified cancer EXAM: PORTABLE CHEST 1 VIEW COMPARISON:  None. FINDINGS: Diffuse patchy sclerotic change in the visualized osseous structures. Normal cardiomediastinal silhouette with normal heart size. No pneumothorax. No pleural effusion. Lungs appear clear, with no acute consolidative airspace disease and no pulmonary edema. No displaced fractures in the visualized chest. IMPRESSION: 1. No active cardiopulmonary disease. 2. Diffuse patchy sclerotic change in the visualized osseous structures compatible with osteoblastic metastases. Electronically Signed   By: Ilona Sorrel M.D.   On: 05/18/2019 09:35   EEG adult  Result Date: 05/18/2019 Lora Havens, MD     05/19/2019  8:24 AM Patient Name: Harry Dixon MRN: [id number] Epilepsy Attending: Lora Havens Referring Physician/Provider: Salvadore Dom, NP Date: 05/18/2019 Duration: 23.56 mins Patient history: 69 year old male with stage IV prostatic cancer, brain mets who presented with altered mental status.  EEG to evaluate for seizures. Level of alertness: awake AEDs during EEG study: None Technical aspects: This EEG study was done with scalp electrodes positioned according to the 10-20 International system of electrode placement. Electrical activity was acquired at a sampling rate of 500Hz  and reviewed with a high frequency filter of 70Hz  and a low frequency filter of 1Hz . EEG data were recorded continuously and digitally stored. Description: During awake state, no clear posterior dominant rhythm was seen.  EEG showed spikes at 1 Hz arising from right frontal-anterior temporal region (maximal F8).  EEG also showed continuous generalized and lateralized right hemisphere 3 to 6 Hz theta-delta slowing. Hyperventilation and photic stimulation were not performed due to AMS. Abnormality -Spikes, right frontal-anterior temporal region -Continuous slow, generalized and lateralized right hemisphere IMPRESSION: This study showed evidence of  epileptogenicity arising from right frontal-anterior temporal region.  Additionally there was evidence of cortical dysfunction right hemisphere likely secondary to underlying metastatic disease. No seizures were seen throughout the recording. Lora Havens    Microbiology: Recent Results (from the past 240 hour(s))  Blood Cultures (routine x 2)     Status: None (Preliminary result)   Collection Time: 05/18/19  9:18 AM   Specimen: BLOOD  Result Value Ref Range Status   Specimen Description BLOOD RIGHT ANTECUBITAL  Final   Special Requests   Final    BOTTLES DRAWN AEROBIC AND ANAEROBIC Blood Culture results may not be optimal due to an inadequate volume of blood received in culture bottles   Culture   Final    NO GROWTH 3 DAYS Performed at [facility], [place] [address] [place], [place] [address]    Report Status PENDING  Incomplete  Blood Cultures (routine x 2)     Status: None (Preliminary result)   Collection Time: 05/18/19  9:26 AM   Specimen: BLOOD  Result Value Ref Range Status   Specimen Description BLOOD LEFT ANTECUBITAL  Final   Special Requests   Final    BOTTLES DRAWN AEROBIC AND ANAEROBIC Blood Culture adequate volume   Culture   Final    NO GROWTH 3 DAYS Performed at [facility], [place] [address] [place], [place] [address]    Report Status PENDING  Incomplete  Urine culture     Status: None   Collection Time: 05/18/19  9:32 AM   Specimen: Urine, Random  Result Value Ref Range Status   Specimen Description URINE, RANDOM  Final   Special Requests NONE  Final   Culture   Final    NO GROWTH Performed at [facility], [place] [address] [place], [place] [address]    Report Status 05/19/2019 FINAL  Final  Respiratory Panel by RT PCR (Flu A&B, Covid) - Nasopharyngeal Swab     Status: None   Collection Time: 05/18/19 12:48 PM   Specimen: Nasopharyngeal Swab  Result Value Ref Range Status   SARS Coronavirus 2 by RT PCR NEGATIVE NEGATIVE Final     Comment: (NOTE) SARS-CoV-2 target nucleic acids are NOT DETECTED. The SARS-CoV-2 RNA is generally detectable in upper respiratoy specimens during the acute phase of infection. The lowest concentration of SARS-CoV-2 viral copies this assay can detect is 131 copies/mL. A negative result does not preclude SARS-Cov-2 infection and should not be used as the sole basis for treatment or other patient management decisions. A negative result may occur with  improper specimen collection/handling, submission of specimen other than nasopharyngeal swab, presence of viral mutation(s) within the areas targeted by this assay, and inadequate number of viral copies (<131 copies/mL). A negative result must be combined with clinical observations, patient history, and epidemiological information. The expected result is Negative. Fact Sheet for Patients:  PinkCheek.be Fact Sheet for Healthcare Providers:  GravelBags.it This test is not yet ap proved or cleared by the [place] FDA and  has been authorized for detection and/or diagnosis of SARS-CoV-2 by FDA under an Emergency Use Authorization (EUA). This EUA will remain  in effect (meaning this test can be used) for the duration of the COVID-19 declaration under Section 564(b)(1) of the Act, 21 U.S.C. section 360bbb-3(b)(1), unless the authorization is terminated or revoked sooner.    Influenza A by PCR NEGATIVE NEGATIVE Final   Influenza B by PCR NEGATIVE NEGATIVE Final    Comment: (NOTE) The  Xpert Xpress SARS-CoV-2/FLU/RSV assay is intended as an aid in  the diagnosis of influenza from Nasopharyngeal swab specimens and  should not be used as a sole basis for treatment. Nasal washings and  aspirates are unacceptable for Xpert Xpress SARS-CoV-2/FLU/RSV  testing. Fact Sheet for Patients: PinkCheek.be Fact Sheet for Healthcare  Providers: GravelBags.it This test is not yet approved or cleared by the [place] FDA and  has been authorized for detection and/or diagnosis of SARS-CoV-2 by  FDA under an Emergency Use Authorization (EUA). This EUA will remain  in effect (meaning this test can be used) for the duration of the  Covid-19 declaration under Section 564(b)(1) of the Act, 21  U.S.C. section 360bbb-3(b)(1), unless the authorization is  terminated or revoked. Performed at [facility], [place] [address] [place], [place] [address]   MRSA PCR Screening     Status: None   Collection Time: 05/18/19  3:21 PM   Specimen: Nasopharyngeal  Result Value Ref Range Status   MRSA by PCR NEGATIVE NEGATIVE Final    Comment:        The GeneXpert MRSA Assay (FDA approved for NASAL specimens only), is one component of a comprehensive MRSA colonization surveillance program. It is not intended to diagnose MRSA infection nor to guide or monitor treatment for MRSA infections. Performed at [facility], [place] [address], [place], [place] [address]      Labs: Basic Metabolic Panel: Recent Labs  Lab 05/18/19 0901 05/18/19 2140 05/19/19 0241 05/19/19 1656  NA 131* 133* 129* 129*  K 5.5* 4.9 5.3* 5.0  CL 98 103 103 102  CO2 11* 14* 13* 12*  GLUCOSE 87 109* 110* 169*  BUN 40* 39* 37* 38*  CREATININE 4.10* 3.00* 2.76* 2.23*  CALCIUM 6.9* 6.6* 6.9* 6.9*   Liver Function Tests: Recent Labs  Lab 05/18/19 0901 05/18/19 2140 05/19/19 0241 05/19/19 1656  AST 314* 264* 215* 122*  ALT 68* 66* 64* 53*  ALKPHOS 2,719* 2,862* 2,193* 2,221*  BILITOT 3.0* 4.4* 5.1* 4.9*  PROT 4.3* 4.2* 4.2* 4.2*  ALBUMIN 1.7* 1.7* 1.8* 1.7*   No results for input(s): LIPASE, AMYLASE in the last 168 hours. Recent Labs  Lab 05/18/19 0926  AMMONIA 33   CBC: Recent Labs  Lab 05/18/19 0901 05/18/19 2140 05/19/19 0241  WBC 2.3*  --  2.3*  NEUTROABS 1.3*  --   --   HGB 6.1* 8.5* 8.4*   HCT 21.6* 26.1* 25.4*  MCV 88.5  --  83.3  PLT 16*  --  17*   Cardiac Enzymes: No results for input(s): CKTOTAL, CKMB, CKMBINDEX, TROPONINI in the last 168 hours. BNP: BNP (last 3 results) No results for input(s): BNP in the last 8760 hours.  ProBNP (last 3 results) No results for input(s): PROBNP in the last 8760 hours.  CBG: Recent Labs  Lab 05/18/19 0917 05/18/19 1519 05/18/19 1938 05/18/19 2315  GLUCAP 76 106* 113* 96       Signed:  Domenic Polite MD.  [facility] 05/21/2019, 11:24 AM

## 2019-05-21 NOTE — Plan of Care (Signed)
  Problem: Education: Goal: Knowledge of General Education information will improve Description: Including pain rating scale, medication(s)/side effects and non-pharmacologic comfort measures Outcome: Adequate for Discharge   Problem: Health Behavior/Discharge Planning: Goal: Ability to manage health-related needs will improve Outcome: Adequate for Discharge   Problem: Clinical Measurements: Goal: Ability to maintain clinical measurements within normal limits will improve 05/21/2019 1519 by Baldo Ash, RN Outcome: Adequate for Discharge 05/21/2019 0934 by Baldo Ash, RN Outcome: Progressing Goal: Will remain free from infection Outcome: Adequate for Discharge Goal: Diagnostic test results will improve Outcome: Adequate for Discharge Goal: Respiratory complications will improve Outcome: Adequate for Discharge Goal: Cardiovascular complication will be avoided Outcome: Adequate for Discharge   Problem: Activity: Goal: Risk for activity intolerance will decrease Outcome: Adequate for Discharge   Problem: Nutrition: Goal: Adequate nutrition will be maintained Outcome: Adequate for Discharge   Problem: Coping: Goal: Level of anxiety will decrease Outcome: Adequate for Discharge   Problem: Elimination: Goal: Will not experience complications related to bowel motility Outcome: Adequate for Discharge Goal: Will not experience complications related to urinary retention Outcome: Adequate for Discharge   Problem: Pain Managment: Goal: General experience of comfort will improve Outcome: Adequate for Discharge   Problem: Safety: Goal: Ability to remain free from injury will improve Outcome: Adequate for Discharge   Problem: Skin Integrity: Goal: Risk for impaired skin integrity will decrease Outcome: Adequate for Discharge

## 2019-05-21 NOTE — Care Management (Addendum)
Spoke w daughter on the phone. She confirms patient will need [facility] transport home, address verified, and someone is home to let him in.  She states that she has been contacted by [employer] for home hospice appointment. I tried to reach [facility], I could not reach the liaison, LVM requesting callback. Spoke w answering service and requested call back from on call nurse to verify that services will start. Spoke w nurse who states that patient is ready for discharge anytime. She confirms DNR is signed, I will print [facility] forms to unit printer.  Confirmation received from [facility], [facility] called, nurse notified.

## 2019-05-25 ENCOUNTER — Other Ambulatory Visit: Payer: Self-pay

## 2019-05-25 ENCOUNTER — Inpatient Hospital Stay (HOSPITAL_COMMUNITY)
Admission: EM | Admit: 2019-05-25 | Discharge: 2019-06-13 | DRG: 951 | Disposition: E | Payer: No Typology Code available for payment source | Attending: Internal Medicine | Admitting: Internal Medicine

## 2019-05-25 ENCOUNTER — Emergency Department (HOSPITAL_COMMUNITY): Payer: No Typology Code available for payment source

## 2019-05-25 ENCOUNTER — Encounter (HOSPITAL_COMMUNITY): Payer: Self-pay | Admitting: Emergency Medicine

## 2019-05-25 DIAGNOSIS — C61 Malignant neoplasm of prostate: Secondary | ICD-10-CM | POA: Diagnosis present

## 2019-05-25 DIAGNOSIS — C7951 Secondary malignant neoplasm of bone: Secondary | ICD-10-CM | POA: Diagnosis present

## 2019-05-25 DIAGNOSIS — N189 Chronic kidney disease, unspecified: Secondary | ICD-10-CM | POA: Diagnosis present

## 2019-05-25 DIAGNOSIS — Z7189 Other specified counseling: Secondary | ICD-10-CM

## 2019-05-25 DIAGNOSIS — Z79899 Other long term (current) drug therapy: Secondary | ICD-10-CM

## 2019-05-25 DIAGNOSIS — Z515 Encounter for palliative care: Secondary | ICD-10-CM | POA: Diagnosis not present

## 2019-05-25 DIAGNOSIS — Z20822 Contact with and (suspected) exposure to covid-19: Secondary | ICD-10-CM | POA: Diagnosis present

## 2019-05-25 DIAGNOSIS — C787 Secondary malignant neoplasm of liver and intrahepatic bile duct: Secondary | ICD-10-CM | POA: Diagnosis present

## 2019-05-25 DIAGNOSIS — Z888 Allergy status to other drugs, medicaments and biological substances status: Secondary | ICD-10-CM

## 2019-05-25 DIAGNOSIS — N179 Acute kidney failure, unspecified: Secondary | ICD-10-CM | POA: Diagnosis present

## 2019-05-25 DIAGNOSIS — Z885 Allergy status to narcotic agent status: Secondary | ICD-10-CM

## 2019-05-25 DIAGNOSIS — R0902 Hypoxemia: Secondary | ICD-10-CM | POA: Diagnosis present

## 2019-05-25 DIAGNOSIS — K7200 Acute and subacute hepatic failure without coma: Secondary | ICD-10-CM | POA: Diagnosis present

## 2019-05-25 DIAGNOSIS — C7931 Secondary malignant neoplasm of brain: Secondary | ICD-10-CM | POA: Diagnosis present

## 2019-05-25 DIAGNOSIS — Z79891 Long term (current) use of opiate analgesic: Secondary | ICD-10-CM

## 2019-05-25 DIAGNOSIS — Z66 Do not resuscitate: Secondary | ICD-10-CM | POA: Diagnosis present

## 2019-05-25 DIAGNOSIS — C799 Secondary malignant neoplasm of unspecified site: Secondary | ICD-10-CM | POA: Diagnosis not present

## 2019-05-25 DIAGNOSIS — Z87440 Personal history of urinary (tract) infections: Secondary | ICD-10-CM

## 2019-05-25 DIAGNOSIS — E872 Acidosis: Secondary | ICD-10-CM | POA: Diagnosis present

## 2019-05-25 DIAGNOSIS — K7290 Hepatic failure, unspecified without coma: Secondary | ICD-10-CM

## 2019-05-25 DIAGNOSIS — K7682 Hepatic encephalopathy: Secondary | ICD-10-CM

## 2019-05-25 LAB — HEPATIC FUNCTION PANEL
ALT: 27 U/L (ref 0–44)
AST: 66 U/L — ABNORMAL HIGH (ref 15–41)
Albumin: 1.8 g/dL — ABNORMAL LOW (ref 3.5–5.0)
Alkaline Phosphatase: 1927 U/L — ABNORMAL HIGH (ref 38–126)
Bilirubin, Direct: 4.5 mg/dL — ABNORMAL HIGH (ref 0.0–0.2)
Indirect Bilirubin: 5.1 mg/dL — ABNORMAL HIGH (ref 0.3–0.9)
Total Bilirubin: 9.6 mg/dL — ABNORMAL HIGH (ref 0.3–1.2)
Total Protein: 4.7 g/dL — ABNORMAL LOW (ref 6.5–8.1)

## 2019-05-25 LAB — BASIC METABOLIC PANEL
BUN: 26 mg/dL — ABNORMAL HIGH (ref 8–23)
CO2: <7 mmol/L — ABNORMAL LOW (ref 22–32)
Calcium: 8.9 mg/dL (ref 8.9–10.3)
Chloride: 101 mmol/L (ref 98–111)
Creatinine, Ser: 1.39 mg/dL — ABNORMAL HIGH (ref 0.61–1.24)
GFR calc Af Amer: >60 mL/min (ref >60)
GFR calc non Af Amer: 52 mL/min — ABNORMAL LOW (ref >60)
Glucose, Bld: 121 mg/dL — ABNORMAL HIGH (ref 70–99)
Potassium: 5.5 mmol/L — ABNORMAL HIGH (ref 3.5–5.1)
Sodium: 132 mmol/L — ABNORMAL LOW (ref 135–145)

## 2019-05-25 LAB — LACTIC ACID, PLASMA
Lactic Acid, Venous: >11 mmol/L (ref 0.5–1.9)
Lactic Acid, Venous: >11 mmol/L (ref 0.5–1.9)

## 2019-05-25 LAB — AMMONIA: Ammonia: 122 umol/L — ABNORMAL HIGH (ref 9–35)

## 2019-05-25 LAB — TYPE AND SCREEN
ABO/RH(D): B POS
Antibody Screen: NEGATIVE

## 2019-05-25 LAB — SARS CORONAVIRUS 2 (TAT 6-24 HRS): SARS Coronavirus 2: NEGATIVE

## 2019-05-25 LAB — CBG MONITORING, ED: Glucose-Capillary: 127 mg/dL — ABNORMAL HIGH (ref 70–99)

## 2019-05-25 MED ORDER — ONDANSETRON HCL 4 MG/2ML IJ SOLN
4.0000 mg | Freq: Once | INTRAMUSCULAR | Status: AC
  Administered 2019-05-25: 4 mg via INTRAVENOUS
  Filled 2019-05-25: qty 2

## 2019-05-25 MED ORDER — ONDANSETRON 4 MG PO TBDP: 4.0000 mg | ORAL_TABLET | Freq: Four times a day (QID) | ORAL | Status: DC | PRN

## 2019-05-25 MED ORDER — ONDANSETRON HCL 4 MG/2ML IJ SOLN: 4.0000 mg | Freq: Four times a day (QID) | INTRAMUSCULAR | Status: DC | PRN

## 2019-05-25 MED ORDER — LORAZEPAM 2 MG/ML IJ SOLN
1.0000 mg | Freq: Once | INTRAMUSCULAR | Status: AC
  Administered 2019-05-25: 1 mg via INTRAVENOUS
  Filled 2019-05-25: qty 1

## 2019-05-25 MED ORDER — LORAZEPAM 2 MG/ML PO CONC: 1.0000 mg | ORAL | Status: DC | PRN

## 2019-05-25 MED ORDER — BIOTENE DRY MOUTH MT LIQD: 15.0000 mL | OROMUCOSAL | Status: DC | PRN

## 2019-05-25 MED ORDER — MORPHINE SULFATE (CONCENTRATE) 10 MG/0.5ML PO SOLN: 5.0000 mg | ORAL | Status: DC | PRN

## 2019-05-25 MED ORDER — SODIUM CHLORIDE 0.9% FLUSH: 3.0000 mL | Freq: Once | INTRAVENOUS | Status: DC

## 2019-05-25 MED ORDER — LORAZEPAM 2 MG/ML IJ SOLN: 1.0000 mg | INTRAMUSCULAR | Status: DC | PRN

## 2019-05-25 MED ORDER — ACETAMINOPHEN 650 MG RE SUPP: 650.0000 mg | Freq: Four times a day (QID) | RECTAL | Status: DC | PRN

## 2019-05-25 MED ORDER — MORPHINE SULFATE (PF) 4 MG/ML IV SOLN
4.0000 mg | Freq: Once | INTRAVENOUS | Status: AC
  Administered 2019-05-25: 4 mg via INTRAVENOUS
  Filled 2019-05-25: qty 1

## 2019-05-25 MED ORDER — SODIUM CHLORIDE 0.9 % IV BOLUS
1000.0000 mL | Freq: Once | INTRAVENOUS | Status: AC
  Administered 2019-05-25: 1000 mL via INTRAVENOUS

## 2019-05-25 MED ORDER — LORAZEPAM 1 MG PO TABS: 1.0000 mg | ORAL_TABLET | ORAL | Status: DC | PRN

## 2019-05-25 MED ORDER — ACETAMINOPHEN 325 MG PO TABS: 650.0000 mg | ORAL_TABLET | Freq: Four times a day (QID) | ORAL | Status: DC | PRN

## 2019-05-25 MED ORDER — MORPHINE SULFATE (CONCENTRATE) 10 MG/0.5ML PO SOLN
5.0000 mg | ORAL | Status: DC | PRN
  Administered 2019-05-25: 19:00:00 5 mg via SUBLINGUAL
  Filled 2019-05-25: qty 0.5

## 2019-05-25 MED ORDER — POLYVINYL ALCOHOL 1.4 % OP SOLN
1.0000 [drp] | Freq: Four times a day (QID) | OPHTHALMIC | Status: DC | PRN
  Filled 2019-05-25: qty 15

## 2019-05-25 NOTE — ED Provider Notes (Signed)
[place] EMERGENCY DEPARTMENT Provider Note   CSN: [id number] Arrival date & time: 06/03/2019  1232     History Chief Complaint  Patient presents with  . Fatigue    Harry Dixon is a 69 y.o. male.  Pt presents to the ED with fatigue and weakness.  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  Pt was admitted from 2/3-6 for septic shock from UTI.  Hospice was consulted while in the hospital and pt was sent home with hospice.  Per pt's daughter, pt has not been eating much.  His 2 daughters have been doing everything for him.  Hospice did come out Sat, Sun, and Monday to set things up.  Another person was supposed to come today.  Pt told his daughter that he wanted to come to the hospital.  Pt's daughter did think he had a seizure to his left side while she was talking to EMS.  The daughter does want him at home and not SNF.  Pt's daughter also told me she did not want him DNR.  She still wants him to have CPR and intubation.        Past Medical History:  Diagnosis Date  . Prostate cancer [facility])     Patient Active Problem List   Diagnosis Date Noted  . Primary malignant neoplasm of prostate metastatic to bone ([place]) 05/21/2019  . Acute cystitis without hematuria   . Palliative care by specialist   . Goals of care, counseling/discussion   . DNR (do not resuscitate)   . Pressure injury of skin 05/19/2019  . Septic shock ([place]) 05/18/2019    History reviewed. No pertinent surgical history.     No family history on file.  Social History   Tobacco Use  . Smoking status: Never Smoker  . Smokeless tobacco: Never Used  Substance Use Topics  . Alcohol use: Never  . Drug use: Not on file    Home Medications Prior to Admission medications   Medication Sig Start Date End Date Taking? Authorizing Provider  cephALEXin (KEFLEX) 250 MG capsule Take 2 capsules (500 mg total) by mouth 2 (two) times  daily for 4 days. continue antibiotics through 05/24/19 05/21/19 05/28/2019 Yes Domenic Polite, MD  dexamethasone (DECADRON) 4 MG tablet Take 1 tablet (4 mg total) by mouth every 12 (twelve) hours. 05/21/19  Yes Domenic Polite, MD  Docusate Sodium (STOOL SOFTENER) 100 MG capsule Take 100 mg by mouth 2 (two) times daily.   Yes [provider]  levETIRAcetam (KEPPRA) 250 MG tablet Take 1 tablet (250 mg total) by mouth 2 (two) times daily. 05/21/19  Yes Domenic Polite, MD  LORazepam (ATIVAN) 0.5 MG tablet Take 0.5 mg by mouth every 8 (eight) hours as needed for anxiety.   Yes [provider]  metroNIDAZOLE (FLAGYL) 500 MG tablet Take 500 mg by mouth 2 (two) times daily.   Yes [provider]  nystatin (MYCOSTATIN) 100000 UNIT/ML suspension Take 5 mLs by mouth 4 (four) times daily. 05/22/19  Yes [provider]  ondansetron (ZOFRAN) 4 MG tablet Take 4 mg by mouth every 8 (eight) hours as needed for nausea or vomiting.   Yes [provider]  oxyCODONE (OXY IR/ROXICODONE) 5 MG immediate release tablet Take 5 mg by mouth 3 (three) times daily.    Yes [provider]  pantoprazole (PROTONIX) 40 MG tablet Take 1 tablet (40 mg total) by mouth daily at  12 noon. 05/21/19  Yes Domenic Polite, MD    Allergies    Methadone and Metformin  Review of Systems   Review of Systems  Neurological: Positive for weakness.  All other systems reviewed and are negative.   Physical Exam Updated Vital Signs BP 113/86   Pulse 78   Temp (!) 97.1 F (36.2 C) (Rectal)   Resp (!) 26   SpO2 100%   Physical Exam Vitals and nursing note reviewed.  Constitutional:      Appearance: He is ill-appearing.  HENT:     Head: Normocephalic and atraumatic.     Right Ear: External ear normal.     Left Ear: External ear normal.     Nose: Nose normal.     Mouth/Throat:     Mouth: Mucous membranes are dry.  Eyes:     Extraocular Movements: Extraocular movements intact.      Conjunctiva/sclera: Conjunctivae normal.     Pupils: Pupils are equal, round, and reactive to light.     Comments: Scleral icterus  Cardiovascular:     Rate and Rhythm: Normal rate and regular rhythm.     Pulses: Normal pulses.     Heart sounds: Normal heart sounds.  Pulmonary:     Effort: Pulmonary effort is normal.     Breath sounds: Normal breath sounds.  Abdominal:     General: Abdomen is flat. Bowel sounds are normal.     Palpations: Abdomen is soft.  Musculoskeletal:        General: Normal range of motion.     Cervical back: Normal range of motion and neck supple.     Right lower leg: Edema present.     Left lower leg: Edema present.  Skin:    General: Skin is warm.     Capillary Refill: Capillary refill takes less than 2 seconds.     Coloration: Skin is jaundiced.  Neurological:     General: No focal deficit present.     Mental Status: He is alert and oriented to person, place, and time.  Psychiatric:        Mood and Affect: Mood normal.        Behavior: Behavior normal.     ED Results / Procedures / Treatments   Labs (all labs ordered are listed, but only abnormal results are displayed) Labs Reviewed  BASIC METABOLIC PANEL - Abnormal; Notable for the following components:      Result Value   Sodium 132 (*)    Potassium 5.5 (*)    CO2 <7 (*)    Glucose, Bld 121 (*)    BUN 26 (*)    Creatinine, Ser 1.39 (*)    GFR calc non Af Amer 52 (*)    All other components within normal limits  LACTIC ACID, PLASMA - Abnormal; Notable for the following components:   Lactic Acid, Venous >11.0 (*)    All other components within normal limits  LACTIC ACID, PLASMA - Abnormal; Notable for the following components:   Lactic Acid, Venous >11.0 (*)    All other components within normal limits  HEPATIC FUNCTION PANEL - Abnormal; Notable for the following components:   Total Protein 4.7 (*)    Albumin 1.8 (*)    AST 66 (*)    Total Bilirubin 9.6 (*)    Bilirubin, Direct 4.5 (*)     Indirect Bilirubin 5.1 (*)    All other components within normal limits  AMMONIA - Abnormal; Notable for the following components:  Ammonia 122 (*)    All other components within normal limits  CBG MONITORING, ED - Abnormal; Notable for the following components:   Glucose-Capillary 127 (*)    All other components within normal limits  CULTURE, BLOOD (ROUTINE X 2)  URINE CULTURE  CULTURE, BLOOD (ROUTINE X 2)  URINALYSIS, ROUTINE W REFLEX MICROSCOPIC  CBC  TYPE AND SCREEN    EKG EKG Interpretation  Date/Time:  Wednesday May 25 2019 13:04:43 EST Ventricular Rate:  112 PR Interval:    QRS Duration: 69 QT Interval:  410 QTC Calculation: 542 R Axis:   3 Text Interpretation: Sinus tachycardia Multiform ventricular premature complexes Low voltage, precordial leads Nonspecific T abnormalities, diffuse leads Prolonged QT interval No significant change since last tracing Confirmed by Isla Pence [phone]) on 05/29/2019 4:12:23 PM   Radiology DG Chest Portable 1 View  Result Date: 06/07/2019 CLINICAL DATA:  Short of breath, lethargy, history of prostate cancer EXAM: PORTABLE CHEST 1 VIEW COMPARISON:  05/18/2019 FINDINGS: Single frontal view of the chest demonstrates a stable cardiac silhouette. There is overall increase in interstitial prominence with patchy bilateral airspace disease greatest in the right upper lobe and left lung base. No effusion or pneumothorax. Diffuse bony metastases are again noted. IMPRESSION: 1. Increasing interstitial and alveolar opacities which could reflect edema or multifocal infection. 2. Diffuse bony metastatic disease. Electronically Signed   By: Randa Ngo M.D.   On: 05/20/2019 15:16    Procedures Aspiration of blood/fluid  Date/Time: 06/11/2019 4:20 PM Performed by: Isla Pence, MD Authorized by: Isla Pence, MD  Consent: The procedure was performed in an emergent situation. Preparation: Patient was prepped and draped in the usual  sterile fashion. Comments: Pt's nurse and phlebotomy were unable to get blood, so I did a right sided fem stick to obtain blood.    (including critical care time)  Medications Ordered in ED Medications  sodium chloride flush (NS) 0.9 % injection 3 mL (has no administration in time range)  morphine 4 MG/ML injection 4 mg (4 mg Intravenous Given 05/17/2019 1526)  ondansetron (ZOFRAN) injection 4 mg (4 mg Intravenous Given 06/03/2019 1526)  sodium chloride 0.9 % bolus 1,000 mL (1,000 mLs Intravenous New Bag/Given 05/30/2019 1525)  LORazepam (ATIVAN) injection 1 mg (1 mg Intravenous Given 05/30/2019 1555)    ED Course  I have reviewed the triage vital signs and the nursing notes.  Pertinent labs & imaging results that were available during my care of the patient were reviewed by me and considered in my medical decision making (see chart for details).    MDM Rules/Calculators/A&P                      Pt started to take a turn for the worse.  He is breathing harder and his BP is dropping.  I spoke with his daughter and explained that by coding him, it would not make him better.  She agreed to the DNR/DNI.    Pt placed on a 100% NRB and given 1L NS, morphine, and ativan.  He is more comfortable.  His 2 daughters are here and with him now.  Pt d/w Dr. Roosevelt Locks ([facility]) for admission.  CRITICAL CARE Performed by: Isla Pence   Total critical care time: 45 minutes  Critical care time was exclusive of separately billable procedures and treating other patients.  Critical care was necessary to treat or prevent imminent or life-threatening deterioration.  Critical care was time spent personally by me on the  following activities: development of treatment plan with patient and/or surrogate as well as nursing, discussions with consultants, evaluation of patient's response to treatment, examination of patient, obtaining history from patient or surrogate, ordering and performing treatments and interventions,  ordering and review of laboratory studies, ordering and review of radiographic studies, pulse oximetry and re-evaluation of patient's condition.     Final Clinical Impression(s) / ED Diagnoses Final diagnoses:  Prostate cancer metastatic to multiple sites [facility])  Admission for palliative care  Hepatic encephalopathy ([place])  DNR (do not resuscitate) discussion    Rx / DC Orders ED Discharge Orders    None       Isla Pence, MD 05/27/2019 1620

## 2019-05-25 NOTE — ED Notes (Signed)
This nurse attempted IV access x 2 with no success. IV team consult in place, phlebotomy made aware of lab needs

## 2019-05-25 NOTE — ED Triage Notes (Signed)
Report from [facility]> Cancer pt from home with report of lethargy.  Pt discharged from hospital on 2/6.

## 2019-05-25 NOTE — ED Notes (Signed)
hospitalist at bedside

## 2019-05-25 NOTE — ED Notes (Signed)
Date and time results received: 05/17/2019  Test: Lactic Acid Critical Value: 11  Name of Provider Notified: Gilford Raid

## 2019-05-25 NOTE — H&P (Addendum)
History and Physical    Harry Dixon [id number] DOB: 1950/08/20 DOA: 05/19/2019  PCP: Patient, No Pcp Per   Patient coming from: Home hospice  I have personally briefly reviewed patient's old medical records in [place]  Chief Complaint: AMS  HPI: Harry Dixon is a 69 y.o. 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  Pt was recently discharged to home hospice after treatment of  septic shock from UTI.    According to patient's daughters, patient has had decreased oral intake and worsening mentation since discharge.    According to the discharge plan, hospice team will set up hospice care on this Monday.  This morning, pt told his daughter that he wanted to come to the hospital.  Patient daughter reported that the patient condition deteriorated very fast in the few hours today, became somnolent after arrival in the ED, significant dyspnea.  ED Course: Patient somnolent, decision was made to admit patient to inpatient hospice.  Review of Systems: Unable to perform, patient somnolent.   Past Medical History:  Diagnosis Date  . Prostate cancer [facility])     History reviewed. No pertinent surgical history.   reports that he has never smoked. He has never used smokeless tobacco. He reports that he does not drink alcohol. No history on file for drug.  Allergies  Allergen Reactions  . Methadone Nausea And Vomiting  . Metformin Nausea Only    No family history on file.   Prior to Admission medications   Medication Sig Start Date End Date Taking? Authorizing Provider  cephALEXin (KEFLEX) 250 MG capsule Take 2 capsules (500 mg total) by mouth 2 (two) times daily for 4 days. continue antibiotics through 05/24/19 05/21/19 05/29/2019 Yes Domenic Polite, MD  dexamethasone (DECADRON) 4 MG tablet Take 1 tablet (4 mg total) by mouth every 12 (twelve) hours. 05/21/19  Yes Domenic Polite, MD  Docusate Sodium (STOOL SOFTENER) 100 MG  capsule Take 100 mg by mouth 2 (two) times daily.   Yes [provider]  levETIRAcetam (KEPPRA) 250 MG tablet Take 1 tablet (250 mg total) by mouth 2 (two) times daily. 05/21/19  Yes Domenic Polite, MD  LORazepam (ATIVAN) 0.5 MG tablet Take 0.5 mg by mouth every 8 (eight) hours as needed for anxiety.   Yes [provider]  metroNIDAZOLE (FLAGYL) 500 MG tablet Take 500 mg by mouth 2 (two) times daily.   Yes [provider]  nystatin (MYCOSTATIN) 100000 UNIT/ML suspension Take 5 mLs by mouth 4 (four) times daily. 05/22/19  Yes [provider]  ondansetron (ZOFRAN) 4 MG tablet Take 4 mg by mouth every 8 (eight) hours as needed for nausea or vomiting.   Yes [provider]  oxyCODONE (OXY IR/ROXICODONE) 5 MG immediate release tablet Take 5 mg by mouth 3 (three) times daily.    Yes [provider]  pantoprazole (PROTONIX) 40 MG tablet Take 1 tablet (40 mg total) by mouth daily at 12 noon. 05/21/19  Yes Domenic Polite, MD    Physical Exam: Vitals:   05/24/2019 1835 05/23/2019 1902 06/12/2019 1906 05/24/2019 1925  BP: (!) 88/31 (!) 116/93 101/66 (!) 89/32  Pulse:    (!) 106  Resp: 20 19 19 18   Temp:      TempSrc:      SpO2:    91%    Constitutional: NAD, calm, comfortable Vitals:   05/31/2019 1835 06/04/2019 1902 05/31/2019 1906 05/25/19 1925  BP: Marland Kitchen)  88/31 (!) 116/93 101/66 (!) 89/32  Pulse:    (!) 106  Resp: 20 19 19 18   Temp:      TempSrc:      SpO2:    91%   Eyes: Not opening eyes, conjunctival icterus ENMT: Mucous membranes are dry.  Neck: normal, supple Respiratory: Severe respiratory distress, decreased breathing sound bilaterally.  Cardiovascular: Regular rate and rhythm, no murmurs / rubs / gallops. No extremity edema. 2+ pedal pulses. No carotid bruits.  Abdomen: no tenderness, no masses palpated. No hepatosplenomegaly. Bowel sounds positive.  Musculoskeletal: Positive for cyanosis Skin: no rashes, lesions, ulcers. No  induration Neurologic:: Opens eyes to pain stimulation Psychiatric: Somnolent    Labs on Admission: I have personally reviewed following labs and imaging studies  CBC: Recent Labs  Lab 05/18/19 2140 05/19/19 0241  WBC  --  2.3*  HGB 8.5* 8.4*  HCT 26.1* 25.4*  MCV  --  83.3  PLT  --  17*   Basic Metabolic Panel: Recent Labs  Lab 05/18/19 2140 05/19/19 0241 05/19/19 1656 06/07/2019 1406  NA 133* 129* 129* 132*  K 4.9 5.3* 5.0 5.5*  CL 103 103 102 101  CO2 14* 13* 12* <7*  GLUCOSE 109* 110* 169* 121*  BUN 39* 37* 38* 26*  CREATININE 3.00* 2.76* 2.23* 1.39*  CALCIUM 6.6* 6.9* 6.9* 8.9   GFR: Estimated Creatinine Clearance: 54.9 mL/min (A) (by C-G formula based on SCr of 1.39 mg/dL (H)). Liver Function Tests: Recent Labs  Lab 05/18/19 2140 05/19/19 0241 05/19/19 1656 06/08/2019 1500  AST 264* 215* 122* 66*  ALT 66* 64* 53* 27  ALKPHOS 2,862* 2,193* 2,221* 1,927*  BILITOT 4.4* 5.1* 4.9* 9.6*  PROT 4.2* 4.2* 4.2* 4.7*  ALBUMIN 1.7* 1.8* 1.7* 1.8*   No results for input(s): LIPASE, AMYLASE in the last 168 hours. Recent Labs  Lab 05/24/2019 1531  AMMONIA 122*   Coagulation Profile: No results for input(s): INR, PROTIME in the last 168 hours. Cardiac Enzymes: No results for input(s): CKTOTAL, CKMB, CKMBINDEX, TROPONINI in the last 168 hours. BNP (last 3 results) No results for input(s): PROBNP in the last 8760 hours. HbA1C: No results for input(s): HGBA1C in the last 72 hours. CBG: Recent Labs  Lab 05/18/19 2315 05/16/2019 1302  GLUCAP 96 127*   Lipid Profile: No results for input(s): CHOL, HDL, LDLCALC, TRIG, CHOLHDL, LDLDIRECT in the last 72 hours. Thyroid Function Tests: No results for input(s): TSH, T4TOTAL, FREET4, T3FREE, THYROIDAB in the last 72 hours. Anemia Panel: No results for input(s): VITAMINB12, FOLATE, FERRITIN, TIBC, IRON, RETICCTPCT in the last 72 hours. Urine analysis:    Component Value Date/Time   COLORURINE BROWN (A) 05/18/2019 0932    APPEARANCEUR CLOUDY (A) 05/18/2019 0932   LABSPEC >1.030 (H) 05/18/2019 0932   PHURINE 5.0 05/18/2019 0932   GLUCOSEU NEGATIVE 05/18/2019 0932   HGBUR LARGE (A) 05/18/2019 0932   BILIRUBINUR MODERATE (A) 05/18/2019 0932   KETONESUR 15 (A) 05/18/2019 0932   PROTEINUR 100 (A) 05/18/2019 0932   NITRITE POSITIVE (A) 05/18/2019 0932   LEUKOCYTESUR MODERATE (A) 05/18/2019 0932    Radiological Exams on Admission: DG Chest Portable 1 View  Result Date: 05/25/2019 CLINICAL DATA:  Short of breath, lethargy, history of prostate cancer EXAM: PORTABLE CHEST 1 VIEW COMPARISON:  05/18/2019 FINDINGS: Single frontal view of the chest demonstrates a stable cardiac silhouette. There is overall increase in interstitial prominence with patchy bilateral airspace disease greatest in the right upper lobe and left lung base. No effusion  or pneumothorax. Diffuse bony metastases are again noted. IMPRESSION: 1. Increasing interstitial and alveolar opacities which could reflect edema or multifocal infection. 2. Diffuse bony metastatic disease. Electronically Signed   By: Randa Ngo M.D.   On: 06/08/2019 15:16    EKG:   Assessment/Plan Active Problems:   Metastatic cancer (HCC)  Metastatic cancer Now is in end-stage, has signs of multiorgan failure: Encephalopathy likely from metastatic brain cancer, plus profound metabolic acidosis probably from combination of liver failure and worsening of kidney function. He also has impending respiratory failure probably from fatigue of compensating metabolic acidosis. Discussed with patient daughter regarding end of life care, patient daughter frustrated about fast deterioration of patient's status, and particularly request less morphine given. I reassured patient or the hospice medication are given on as needed basis. Admitted to inpatient hospice. And Ativan as needed.  Acute on chronic hepatic failure, as above.  AKI on CKD, hospice as above.  Stage IV  metastatic prostate cancer, inpatient hospice.    DVT prophylaxis: None Code Status: DNR Family Communication: Daughters Disposition Plan: Inpatient hospice Consults called: None Admission status: Inpatient hospice   Lequita Halt MD [facility] Pager [phone]    06/03/2019, 8:00 PM

## 2019-05-25 NOTE — Progress Notes (Signed)
VAST consulted to obtain IV access. Upon arrival at bedside, 18g EJ IV placed and labs collected. VAST services no longer needed.

## 2019-05-25 NOTE — ED Notes (Signed)
Family at bedside. 

## 2019-05-26 DIAGNOSIS — Z515 Encounter for palliative care: Principal | ICD-10-CM

## 2019-05-26 DIAGNOSIS — K7290 Hepatic failure, unspecified without coma: Secondary | ICD-10-CM

## 2019-05-26 DIAGNOSIS — C61 Malignant neoplasm of prostate: Secondary | ICD-10-CM

## 2019-05-27 LAB — CULTURE, BLOOD (ROUTINE X 2)
Culture: NO GROWTH
Culture: NO GROWTH
Special Requests: ADEQUATE

## 2019-05-30 LAB — CULTURE, BLOOD (ROUTINE X 2)
Culture: NO GROWTH
Culture: NO GROWTH

## 2019-06-13 NOTE — Progress Notes (Signed)
Pt admitted earlier in shift for comfort care. He passed away at [place]. Pronounced by 2 RNs. Death certificate completed and "tubed" to 6N.  KJKG, NP [facility]

## 2019-06-13 NOTE — Progress Notes (Signed)
Pt. transferred to 6N21 from ED. Pt. was transferred from stretcher to the bed. Pt. is unresponsive at this time but appears to be comfortable. Pt. has 2 daughters present and all questions answered to their satisfaction. Encouraged pt. daughters to call during the night for updates. Will continue to monitor.

## 2019-06-13 NOTE — Plan of Care (Signed)
  Problem: Role Relationship: Goal: Family's ability to cope with current situation will improve Outcome: Progressing Goal: Ability to verbalize concerns, feelings, and thoughts to partner or family member will improve Outcome: Progressing   

## 2019-06-13 NOTE — Death Summary Note (Signed)
Physician Discharge Summary  Arsh Grauel [id number] DOB: 1950-12-04 DOA: 05/17/2019  PCP: Patient, No Pcp Per  Admit date: 05/29/2019 Discharge date: 05/28/2019  Admitted From: Home Hospice Disposition: Patient deceased  Recommendations for Outpatient Follow-up:  [place]    Discharge Condition: Deceased CODE STATUS: DNR   Brief/Interim Summary:  69 y.o. 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 Pt was recently discharged to home hospice after treatment of  septic shock from UTI. However, patient's condition significantly deteriorated and has had decreased oral intake and worsening mentation since discharge.  Hospice team was contacted to set up hospice care but patient became unresponsive and family made the decision to take pt to ER.  This morning, pt told his daughter that he wanted to come to the hospital.  In ED, patient became somnolent and was found to have profound hypoxia.I communicated with pt's family and both daughters desired inpatient hospice given the critically ill conditions pt had. Exam and workup showed pt had worsening multi-organ failure and drastic acidemia, thus patient admitted to inpatient hospice for end-of-life care, PRN ativan and morphine started. Pt pronounced dead on 0105 next morning.   Discharge Diagnoses:  Active Problems:   Metastatic cancer ([place])   Hepatic encephalopathy ([place])    Discharge Instructions   Allergies as of 06/15/2019      Reactions   Methadone Nausea And Vomiting   Metformin Nausea Only      Medication List    ASK your doctor about these medications   cephALEXin 250 MG capsule Commonly known as: KEFLEX Take 2 capsules (500 mg total) by mouth 2 (two) times daily for 4 days. continue antibiotics through 05/24/19 Ask about: Should I take this medication?   dexamethasone 4 MG tablet Commonly known as: DECADRON Take 1 tablet (4 mg total) by mouth every 12  (twelve) hours.   levETIRAcetam 250 MG tablet Commonly known as: KEPPRA Take 1 tablet (250 mg total) by mouth 2 (two) times daily.   LORazepam 0.5 MG tablet Commonly known as: ATIVAN Take 0.5 mg by mouth every 8 (eight) hours as needed for anxiety.   metroNIDAZOLE 500 MG tablet Commonly known as: FLAGYL Take 500 mg by mouth 2 (two) times daily.   nystatin 100000 UNIT/ML suspension Commonly known as: MYCOSTATIN Take 5 mLs by mouth 4 (four) times daily.   ondansetron 4 MG tablet Commonly known as: ZOFRAN Take 4 mg by mouth every 8 (eight) hours as needed for nausea or vomiting.   oxyCODONE 5 MG immediate release tablet Commonly known as: Oxy IR/ROXICODONE Take 5 mg by mouth 3 (three) times daily.   pantoprazole 40 MG tablet Commonly known as: PROTONIX Take 1 tablet (40 mg total) by mouth daily at 12 noon.   Stool Softener 100 MG capsule Generic drug: Docusate Sodium Take 100 mg by mouth 2 (two) times daily.       Allergies  Allergen Reactions  . Methadone Nausea And Vomiting  . Metformin Nausea Only    Consultations:  Hospice   Procedures/Studies: CT ABDOMEN PELVIS WO CONTRAST  Result Date: 05/18/2019 CLINICAL DATA:  Abdominal pain, history of prostate cancer EXAM: CT ABDOMEN AND PELVIS WITHOUT CONTRAST TECHNIQUE: Multidetector CT imaging of the abdomen and pelvis was performed following the standard protocol without IV contrast. COMPARISON:  None. FINDINGS: Lower chest: There are small bilateral pleural effusions. No acute parenchymal lung disease. Incidental bilateral gynecomastia. Hepatobiliary: Too numerous to count hypodense masses throughout the  liver compatible with diffuse hepatic metastases. Largest lesion within the inferior right lobe measuring approximately 4.5 cm reference image 33 of series 2. Gallbladder is unremarkable. No biliary dilatation. Pancreas: Pancreas is unremarkable. Spleen: Spleen is normal in appearance and size. Adrenals/Urinary Tract: No  evidence of urinary tract calculi or obstructive uropathy. Kidneys and adrenal glands are grossly unremarkable. Diffuse bladder wall thickening may reflect chronic bladder outlet obstruction. Stomach/Bowel: No evidence of bowel obstruction or ileus. Normal appendix right lower quadrant. Vascular/Lymphatic: There is extensive retroperitoneal lymphadenopathy. Index left para-aortic lymph node reference image 36 series 2 measures 18 x 22 mm. Lymphadenopathy extends to the pelvis, with largest left external iliac chain lymph node measuring 26 x 11 mm reference image 76 of series 2. No evidence of aortic aneurysm. Mild atherosclerosis of the distal aorta and its branches. Reproductive: Fiduciary markers are seen within the prostate. Prostate measures 2.8 x 3.5 by 3.1 cm. Other: No abdominal wall hernia.  No free fluid. Musculoskeletal: There is extensive diffuse bony metastatic disease involving all visualized bony structures. No evidence of pathologic fracture. Reconstructed images demonstrate no additional findings. IMPRESSION: 1. 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 Metastatic retroperitoneal lymphadenopathy. Diffuse hepatic metastases, and diffuse skeletal metastases as above. 2. Trace bilateral pleural effusions. Electronically Signed   By: Randa Ngo M.D.   On: 05/18/2019 12:26   CT HEAD WO CONTRAST  Result Date: 05/18/2019 CLINICAL DATA:  69 year old male with encephalopathy. "Unspecified cancer". EXAM: CT HEAD WITHOUT CONTRAST TECHNIQUE: Contiguous axial images were obtained from the base of the skull through the vertex without intravenous contrast. COMPARISON:  None. FINDINGS: Brain: Hypodensity in the right inferior frontal gyrus most suggestive of mass related vasogenic edema (series 2, image 14). Mild regional mass effect including on the right frontal horn. Additional edema suspected in the right middle frontal gyrus on coronal image 20. Additionally, there is probable edema  in the anterior right temporal lobe best seen on sagittal image 20. But no left hemisphere or posterior fossa vasogenic edema identified. No associated intracranial hemorrhage. No midline shift or loss of basilar cisterns. No ventriculomegaly. No cortically based acute infarct identified. Vascular: Calcified atherosclerosis at the skull base. No suspicious intracranial vascular hyperdensity. Skull: Permeative, moth eaten appearance of bone throughout the skull base, including the central skull base, visible mandible, zygoma and maxilla. Smaller lytic lesions are scattered in the calvarium mostly along the coronal sutures. No definite extraosseous extension of tumor is identified by plain CT. Sinuses/Orbits: Abnormal sphenoid and right posterior ethmoid sinus opacification in conjunction with the highly abnormal appearance of the skull base. Similar partially opacified bilateral mastoid air cells and petrous apex air cells. The right tympanic cavity is partially opacified. Other: No orbit or scalp soft tissue mass identified. IMPRESSION: 1. Diffuse skull base infiltration by tumor/metastasis, and evidence of multifocal right hemisphere brain metastases with mild-to-moderate vasogenic edema. 2. No significant intracranial mass effect. Electronically Signed   By: Genevie Ann M.D.   On: 05/18/2019 12:34   US RENAL  Result Date: 05/18/2019 CLINICAL DATA:  Acute renal failure.  History of prostate cancer. EXAM: RENAL / URINARY TRACT ULTRASOUND COMPLETE COMPARISON:  CT scan of the abdomen dated 05/18/2019 FINDINGS: Right Kidney: Renal measurements: 10.1 x 4.3 x 5.7 cm = volume: 130 mL . Echogenicity within normal limits. No mass or hydronephrosis visualized. Left Kidney: Renal measurements: 10.1 x 5.6 x 6.4 cm = volume: 189 mL. Echogenicity within normal limits. No mass or hydronephrosis visualized. Bladder: Appears normal for degree  of bladder distention. Other: There is diffuse thickening of the bladder wall to 7 mm, as  demonstrated on the CT scan. Ultrasound also demonstrates multiple hypoechoic but non cystic lesions in the liver which are worrisome for metastases. IMPRESSION: 1. Normal appearing kidneys. 2. Thickening of the bladder wall, nonspecific. This may be related to the patient's prostate cancer if it previously created bladder outlet obstruction. 3. Incidental note is made of hypoechoic lesions in the liver worrisome for metastatic disease. Electronically Signed   By: Lorriane Shire M.D.   On: 05/18/2019 16:18   DG Chest Portable 1 View  Result Date: 06/04/2019 CLINICAL DATA:  Short of breath, lethargy, history of prostate cancer EXAM: PORTABLE CHEST 1 VIEW COMPARISON:  05/18/2019 FINDINGS: Single frontal view of the chest demonstrates a stable cardiac silhouette. There is overall increase in interstitial prominence with patchy bilateral airspace disease greatest in the right upper lobe and left lung base. No effusion or pneumothorax. Diffuse bony metastases are again noted. IMPRESSION: 1. Increasing interstitial and alveolar opacities which could reflect edema or multifocal infection. 2. Diffuse bony metastatic disease. Electronically Signed   By: Randa Ngo M.D.   On: 06/06/2019 15:16   DG Chest Port 1 View  Result Date: 05/18/2019 CLINICAL DATA:  Altered mental status, history of unspecified cancer EXAM: PORTABLE CHEST 1 VIEW COMPARISON:  None. FINDINGS: Diffuse patchy sclerotic change in the visualized osseous structures. Normal cardiomediastinal silhouette with normal heart size. No pneumothorax. No pleural effusion. Lungs appear clear, with no acute consolidative airspace disease and no pulmonary edema. No displaced fractures in the visualized chest. IMPRESSION: 1. No active cardiopulmonary disease. 2. Diffuse patchy sclerotic change in the visualized osseous structures compatible with osteoblastic metastases. Electronically Signed   By: Ilona Sorrel M.D.   On: 05/18/2019 09:35   EEG adult  Result  Date: 05/18/2019 Lora Havens, MD     05/19/2019  8:24 AM Patient Name: Harry Dixon MRN: [id number] Epilepsy Attending: Lora Havens Referring Physician/Provider: Salvadore Dom, NP Date: 05/18/2019 Duration: 23.56 mins Patient history: 69 year old male with stage IV prostatic cancer, brain mets who presented with altered mental status.  EEG to evaluate for seizures. Level of alertness: awake AEDs during EEG study: None Technical aspects: This EEG study was done with scalp electrodes positioned according to the 10-20 International system of electrode placement. Electrical activity was acquired at a sampling rate of 500Hz  and reviewed with a high frequency filter of 70Hz  and a low frequency filter of 1Hz . EEG data were recorded continuously and digitally stored. Description: During awake state, no clear posterior dominant rhythm was seen.  EEG showed spikes at 1 Hz arising from right frontal-anterior temporal region (maximal F8).  EEG also showed continuous generalized and lateralized right hemisphere 3 to 6 Hz theta-delta slowing. Hyperventilation and photic stimulation were not performed due to AMS. 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  Additionally there was evidence of cortical dysfunction right hemisphere likely secondary to underlying metastatic disease. No seizures were seen throughout the recording. Priyanka [name]    (Echo, Carotid, EGD, Colonoscopy, ERCP)    Subjective:   Discharge Exam: Vitals:   05/24/2019 1925 06/06/2019 2014  BP: (!) 89/32 (!) 178/157  Pulse: (!) 106 (!) 180  Resp: 18   Temp:    SpO2: 91%    Vitals:   05/20/2019 1925 05/16/2019 2013 06/09/2019 2014 13-Jun-2019 0117  BP: (!) 89/32  (!) 178/157   Pulse: (!) 106  (!) 180   Resp: 18     Temp:      TempSrc:      SpO2: 91%      Weight:  79.7 kg  79.7 kg  Height:  5\' 11"  (1.803 m)  5\' 11"  (1.803 m)    General: Pt is alert, awake, not in acute distress Cardiovascular: RRR, S1/S2 +, no rubs, no gallops Respiratory: CTA bilaterally, no wheezing, no rhonchi Abdominal: Soft, NT, ND, bowel sounds + Extremities: no edema, no cyanosis    The results of significant diagnostics from this hospitalization (including imaging, microbiology, ancillary and laboratory) are listed below for reference.     Microbiology: Recent Results (from the past 240 hour(s))  MRSA PCR Screening     Status: None   Collection Time: 05/18/19  3:21 PM   Specimen: Nasopharyngeal  Result Value Ref Range Status   MRSA by PCR NEGATIVE NEGATIVE Final    Comment:        The GeneXpert MRSA Assay (FDA approved for NASAL specimens only), is one component of a comprehensive MRSA colonization surveillance program. It is not intended to diagnose MRSA infection nor to guide or monitor treatment for MRSA infections. Performed at [facility], [place] [address] [place], [place] [address]   Culture, blood (routine x 2)     Status: None (Preliminary result)   Collection Time: 05/17/2019  2:28 PM   Specimen: BLOOD  Result Value Ref Range Status   Specimen Description BLOOD RIGHT ANTECUBITAL  Final   Special Requests   Final    BOTTLES DRAWN AEROBIC ONLY Blood Culture results may not be optimal due to an inadequate volume of blood received in culture bottles   Culture   Final    NO GROWTH 3 DAYS Performed at [facility], [place] [address] [place], [place] [address]    Report Status PENDING  Incomplete  Culture, blood (routine x 2)     Status: None (Preliminary result)   Collection Time: 06/10/2019  2:39 PM   Specimen: BLOOD RIGHT ARM  Result Value Ref Range Status   Specimen Description BLOOD RIGHT ARM  Final   Special Requests   Final    BOTTLES DRAWN AEROBIC ONLY Blood Culture results may not be optimal due to an inadequate volume of  blood received in culture bottles   Culture   Final    NO GROWTH 3 DAYS Performed at [facility], [place] [address] [place], [place] [address]    Report Status PENDING  Incomplete  SARS CORONAVIRUS 2 (TAT 6-24 HRS) Nasopharyngeal Nasopharyngeal Swab     Status: None   Collection Time: 06/05/2019  5:01 PM   Specimen: Nasopharyngeal Swab  Result Value Ref Range Status   SARS Coronavirus 2 NEGATIVE NEGATIVE Final    Comment: (NOTE) SARS-CoV-2 target nucleic acids are NOT DETECTED. The SARS-CoV-2 RNA is generally detectable in upper and lower respiratory specimens during the acute phase of infection. Negative results do not preclude SARS-CoV-2 infection, do not rule out co-infections with other pathogens, and should not be used as the sole basis for treatment or other patient management decisions. Negative results must be combined with clinical observations, patient history, and epidemiological information. The expected result is Negative. Fact Sheet for Patients: SugarRoll.be Fact Sheet for Healthcare Providers: https://www.woods-mathews.com/ This test is not yet approved or cleared by the [place] FDA and  has been authorized for  detection and/or diagnosis of SARS-CoV-2 by FDA under an Emergency Use Authorization (EUA). This EUA will remain  in effect (meaning this test can be used) for the duration of the COVID-19 declaration under Section 56 4(b)(1) of the Act, 21 U.S.C. section 360bbb-3(b)(1), unless the authorization is terminated or revoked sooner. Performed at [facility], [place] [address], [place], [place] [address]      Labs: BNP (last 3 results) No results for input(s): BNP in the last 8760 hours. Basic Metabolic Panel: Recent Labs  Lab 05/29/2019 1406  NA 132*  K 5.5*  CL 101  CO2 <7*  GLUCOSE 121*  BUN 26*  CREATININE 1.39*  CALCIUM 8.9   Liver Function Tests: Recent Labs  Lab 06/10/2019 1500  AST 66*   ALT 27  ALKPHOS 1,927*  BILITOT 9.6*  PROT 4.7*  ALBUMIN 1.8*   No results for input(s): LIPASE, AMYLASE in the last 168 hours. Recent Labs  Lab 05/28/2019 1531  AMMONIA 122*   CBC: No results for input(s): WBC, NEUTROABS, HGB, HCT, MCV, PLT in the last 168 hours. Cardiac Enzymes: No results for input(s): CKTOTAL, CKMB, CKMBINDEX, TROPONINI in the last 168 hours. BNP: Invalid input(s): POCBNP CBG: Recent Labs  Lab 05/25/2019 1302  GLUCAP 127*   D-Dimer No results for input(s): DDIMER in the last 72 hours. Hgb A1c No results for input(s): HGBA1C in the last 72 hours. Lipid Profile No results for input(s): CHOL, HDL, LDLCALC, TRIG, CHOLHDL, LDLDIRECT in the last 72 hours. Thyroid function studies No results for input(s): TSH, T4TOTAL, T3FREE, THYROIDAB in the last 72 hours.  Invalid input(s): FREET3 Anemia work up No results for input(s): VITAMINB12, FOLATE, FERRITIN, TIBC, IRON, RETICCTPCT in the last 72 hours. Urinalysis    Component Value Date/Time   COLORURINE BROWN (A) 05/18/2019 0932   APPEARANCEUR CLOUDY (A) 05/18/2019 0932   LABSPEC >1.030 (H) 05/18/2019 0932   PHURINE 5.0 05/18/2019 0932   GLUCOSEU NEGATIVE 05/18/2019 0932   HGBUR LARGE (A) 05/18/2019 0932   BILIRUBINUR MODERATE (A) 05/18/2019 0932   KETONESUR 15 (A) 05/18/2019 0932   PROTEINUR 100 (A) 05/18/2019 0932   NITRITE POSITIVE (A) 05/18/2019 0932   LEUKOCYTESUR MODERATE (A) 05/18/2019 0932   Sepsis Labs Invalid input(s): PROCALCITONIN,  WBC,  LACTICIDVEN Microbiology Recent Results (from the past 240 hour(s))  MRSA PCR Screening     Status: None   Collection Time: 05/18/19  3:21 PM   Specimen: Nasopharyngeal  Result Value Ref Range Status   MRSA by PCR NEGATIVE NEGATIVE Final    Comment:        The GeneXpert MRSA Assay (FDA approved for NASAL specimens only), is one component of a comprehensive MRSA colonization surveillance program. It is not intended to diagnose MRSA infection nor to  guide or monitor treatment for MRSA infections. Performed at [facility], [place] [address], [place], [place] [address]   Culture, blood (routine x 2)     Status: None (Preliminary result)   Collection Time: 05/25/19  2:28 PM   Specimen: BLOOD  Result Value Ref Range Status   Specimen Description BLOOD RIGHT ANTECUBITAL  Final   Special Requests   Final    BOTTLES DRAWN AEROBIC ONLY Blood Culture results may not be optimal due to an inadequate volume of blood received in culture bottles   Culture   Final    NO GROWTH 3 DAYS Performed at [facility], [place] [address] [place],  [address]    Report  Status PENDING  Incomplete  Culture, blood (routine x 2)     Status: None (Preliminary result)   Collection Time: 06/01/2019  2:39 PM   Specimen: BLOOD RIGHT ARM  Result Value Ref Range Status   Specimen Description BLOOD RIGHT ARM  Final   Special Requests   Final    BOTTLES DRAWN AEROBIC ONLY Blood Culture results may not be optimal due to an inadequate volume of blood received in culture bottles   Culture   Final    NO GROWTH 3 DAYS Performed at [facility], [place] [address] [place], [place] [address]    Report Status PENDING  Incomplete  SARS CORONAVIRUS 2 (TAT 6-24 HRS) Nasopharyngeal Nasopharyngeal Swab     Status: None   Collection Time: 05/29/2019  5:01 PM   Specimen: Nasopharyngeal Swab  Result Value Ref Range Status   SARS Coronavirus 2 NEGATIVE NEGATIVE Final    Comment: (NOTE) SARS-CoV-2 target nucleic acids are NOT DETECTED. The SARS-CoV-2 RNA is generally detectable in upper and lower respiratory specimens during the acute phase of infection. Negative results do not preclude SARS-CoV-2 infection, do not rule out co-infections with other pathogens, and should not be used as the sole basis for treatment or other patient management decisions. Negative results must be combined with clinical observations, patient history, and epidemiological information.  The expected result is Negative. Fact Sheet for Patients: SugarRoll.be Fact Sheet for Healthcare Providers: https://www.woods-mathews.com/ This test is not yet approved or cleared by the [place] FDA and  has been authorized for detection and/or diagnosis of SARS-CoV-2 by FDA under an Emergency Use Authorization (EUA). This EUA will remain  in effect (meaning this test can be used) for the duration of the COVID-19 declaration under Section 56 4(b)(1) of the Act, 21 U.S.C. section 360bbb-3(b)(1), unless the authorization is terminated or revoked sooner. Performed at [facility], [place] [address] [place], [place] [address]      Time coordinating discharge: Over 30 minutes  SIGNED:   Lequita Halt, MD  [facility] 05/28/2019, 3:17 PM Pager [phone]

## 2019-06-13 DEATH — deceased

## 2019-06-29 DIAGNOSIS — N179 Acute kidney failure, unspecified: Secondary | ICD-10-CM

## 2021-07-25 IMAGING — DX DG CHEST 1V PORT
1 series · 1 of 1 positions shown · non-contrast
Comparison: None.

CLINICAL DATA: Altered mental status, history of unspecified cancer

EXAM:
PORTABLE CHEST 1 VIEW

[chest ap]
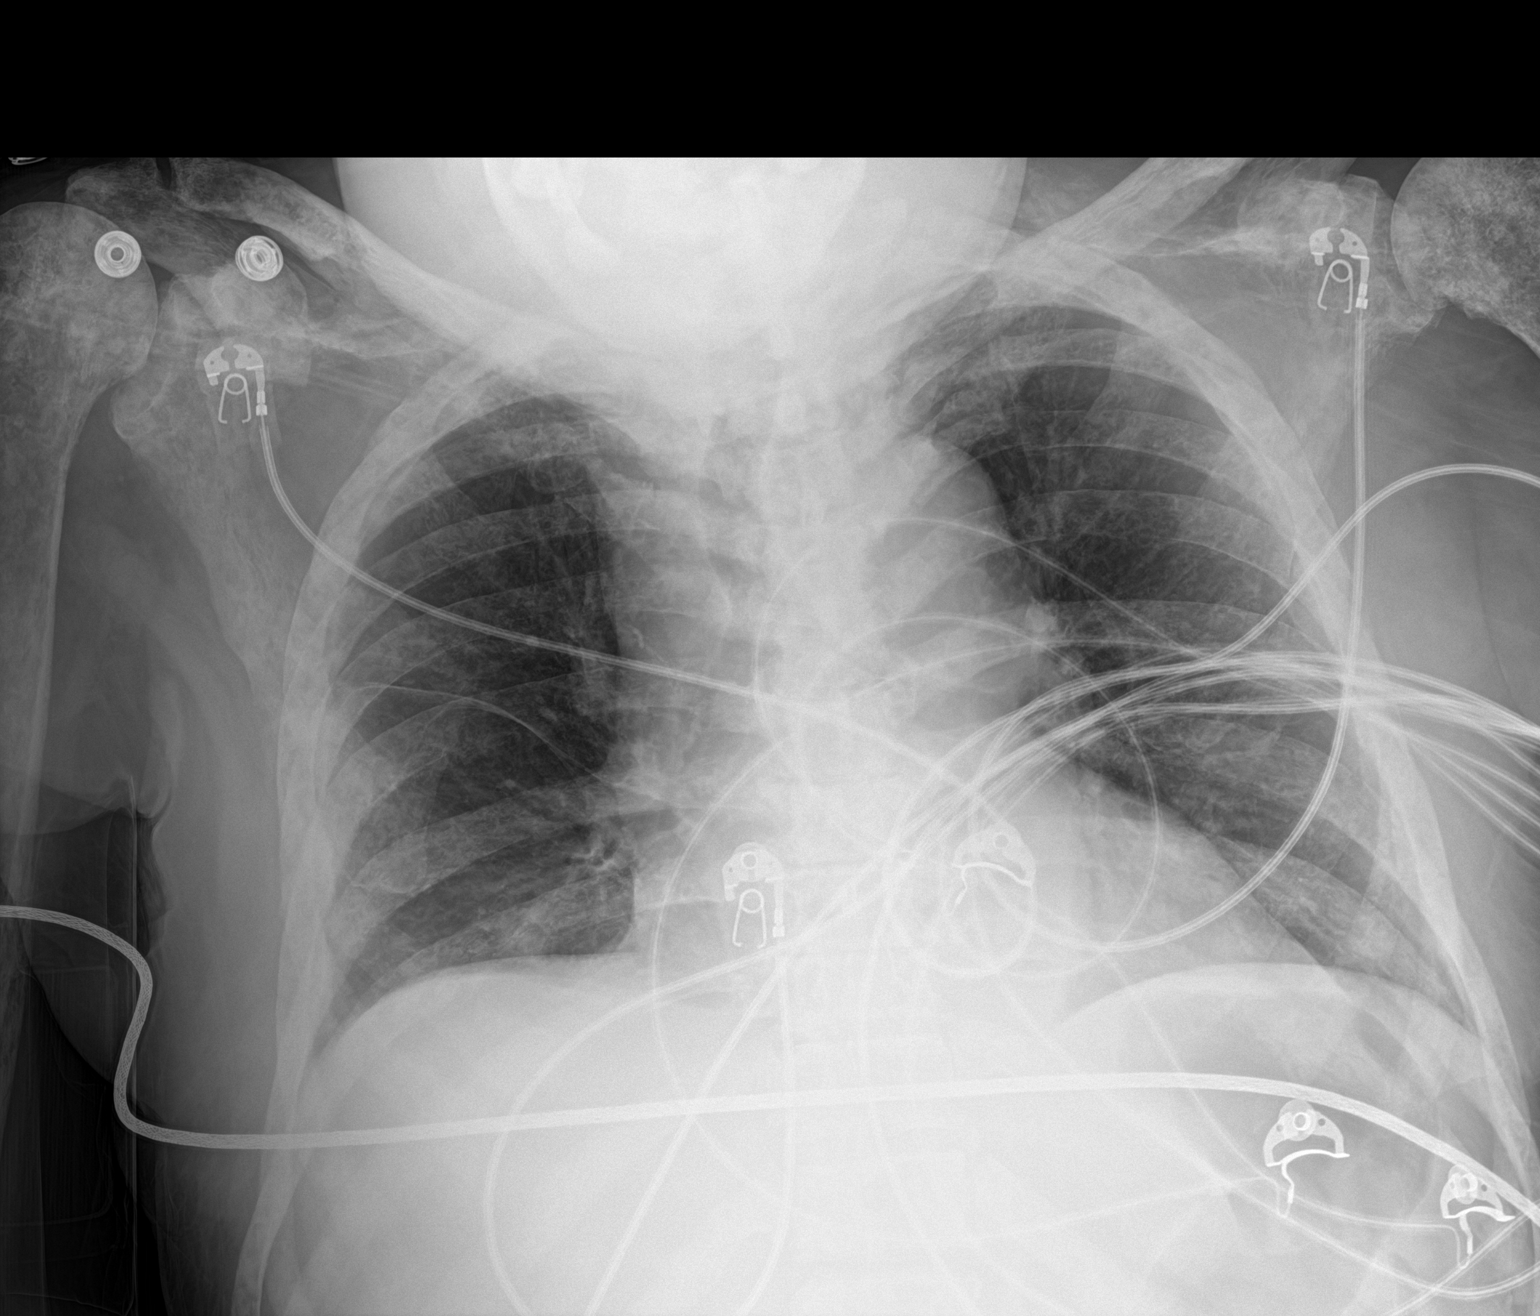

[1 of 1 positions shown; findings below may reference images not displayed]

FINDINGS: Diffuse patchy sclerotic change in the visualized osseous
structures. Normal cardiomediastinal silhouette with normal heart
size. No pneumothorax. No pleural effusion. Lungs appear clear, with
no acute consolidative airspace disease and no pulmonary edema. No
displaced fractures in the visualized chest.
IMPRESSION: 1. No active cardiopulmonary disease.
2. Diffuse patchy sclerotic change in the visualized osseous
structures compatible with osteoblastic metastases.

## 2021-07-25 IMAGING — CT CT HEAD W/O CM
4 series · 16 of 47 positions shown, 18 images · non-contrast
Comparison: None.

CLINICAL DATA: 68-year-old male with encephalopathy. "Unspecified
cancer".

EXAM:
CT HEAD WITHOUT CONTRAST
TECHNIQUE: Contiguous axial images were obtained from the base of the skull
through the vertex without intravenous contrast.

[Series 2: head without · axial · non-contrast · 0.47mm/px · z∈[-98,+17]mm · 7 of 31 slices shown, 9 images]
[im 4/31  brain]
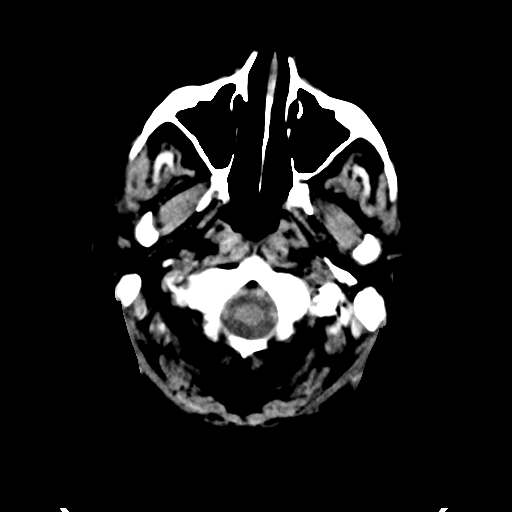
[im 4/31  bone]
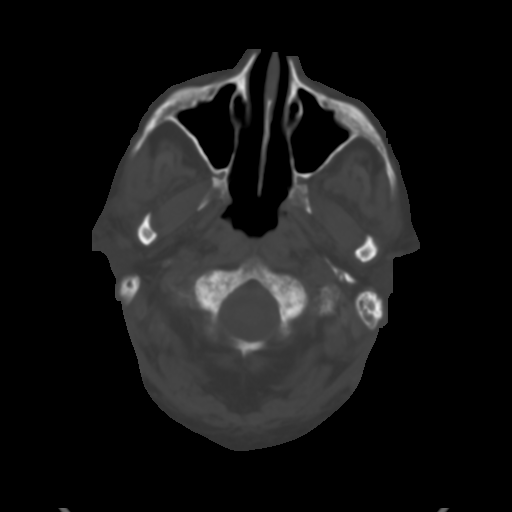
[im 8/31  brain]
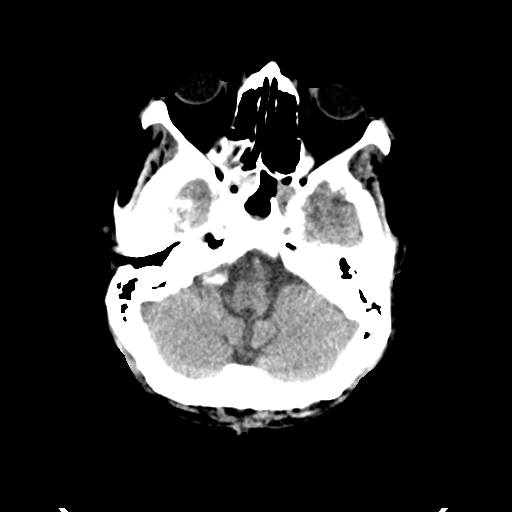
[im 12/31  brain]
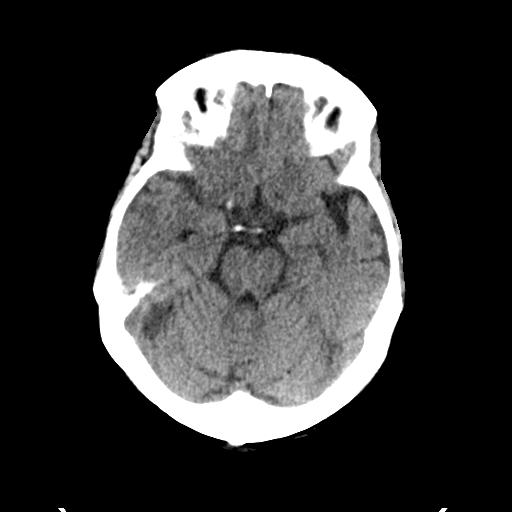
[im 16/31  brain]
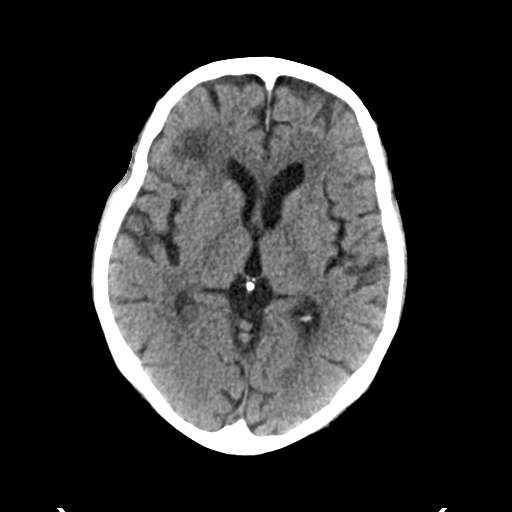
[im 19/31  brain]
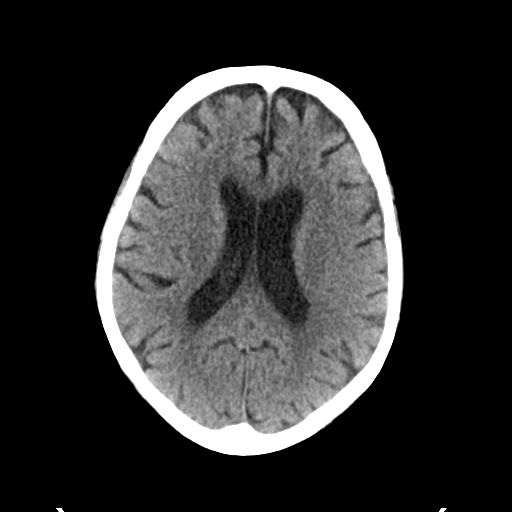
[im 19/31  bone]
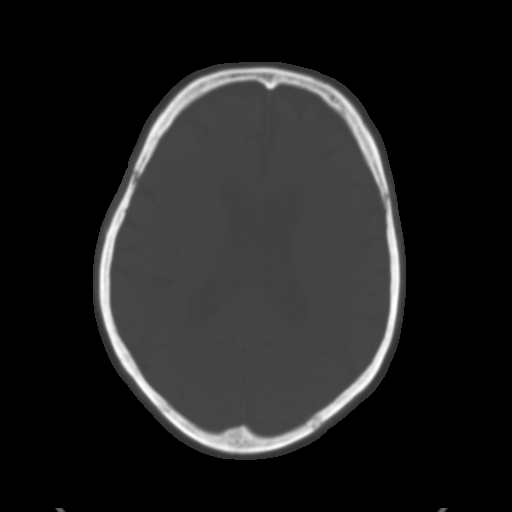
[im 23/31  brain]
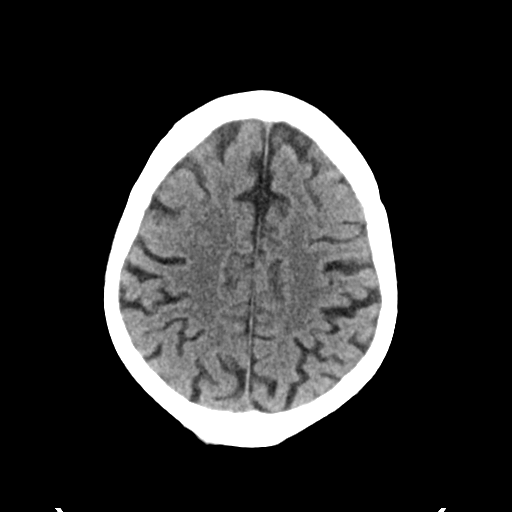
[im 27/31  brain]
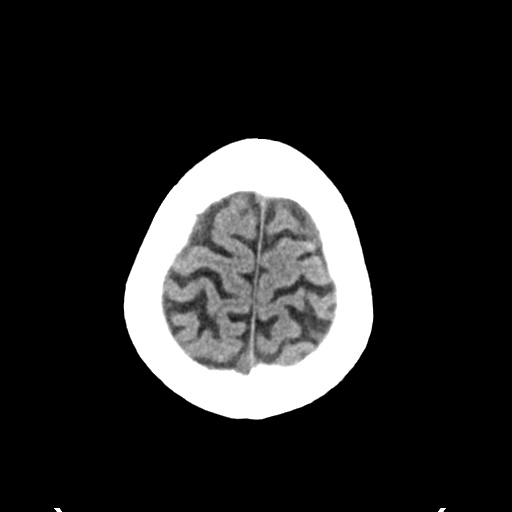

[Series 3: head bone · axial · 0.47mm/px · z∈[-99,-69]mm · 3 of 76 slices shown]
[im 8/76  bone]
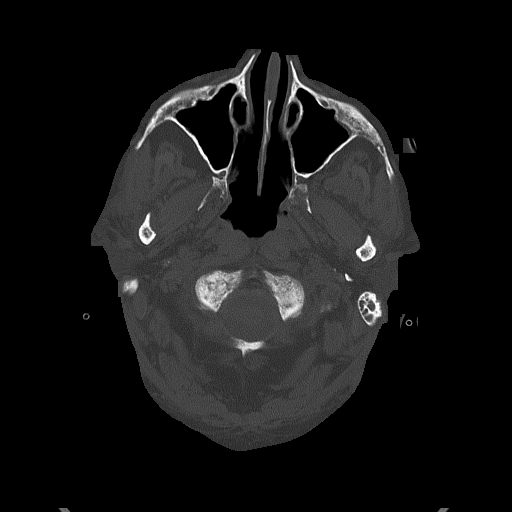
[im 16/76  bone]
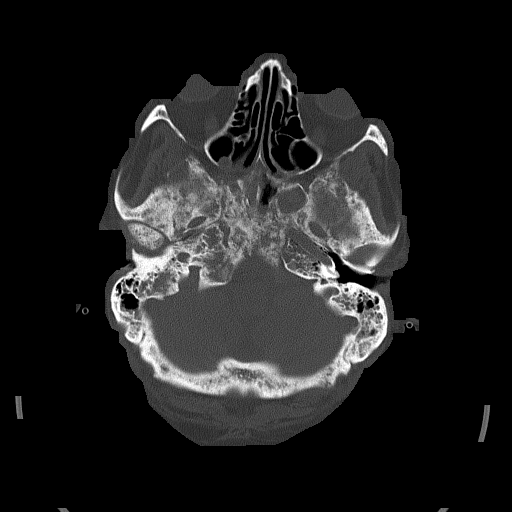
[im 23/76  bone]
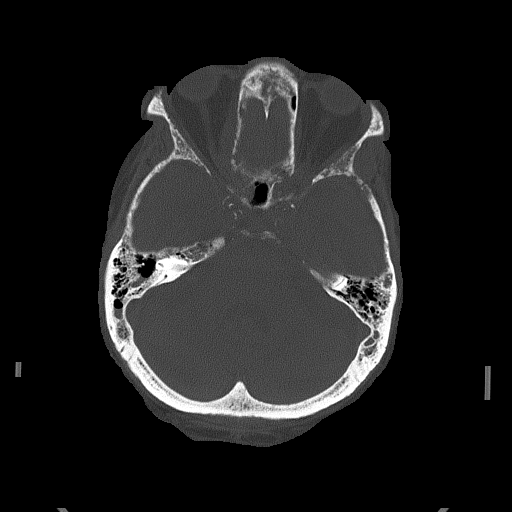

[Series 4: head without cor · coronal · non-contrast · 0.34mm/px · 3 of 73 slices shown]
[im 25/73  brain]
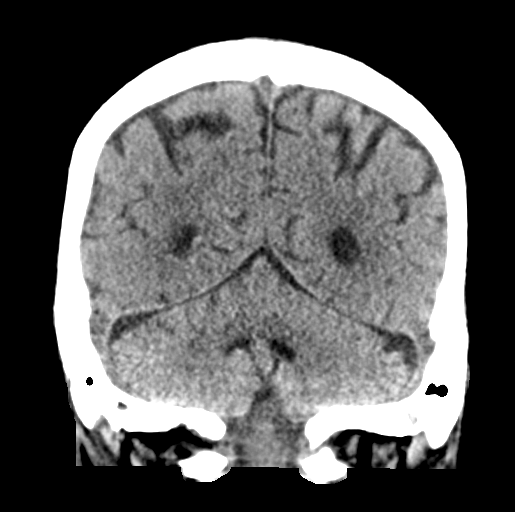
[im 33/73  brain]
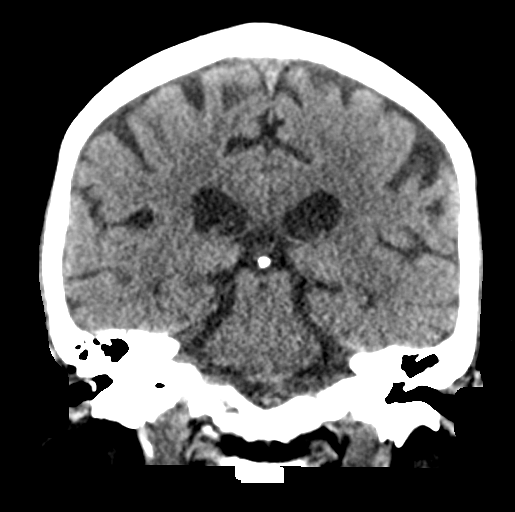
[im 41/73  brain]
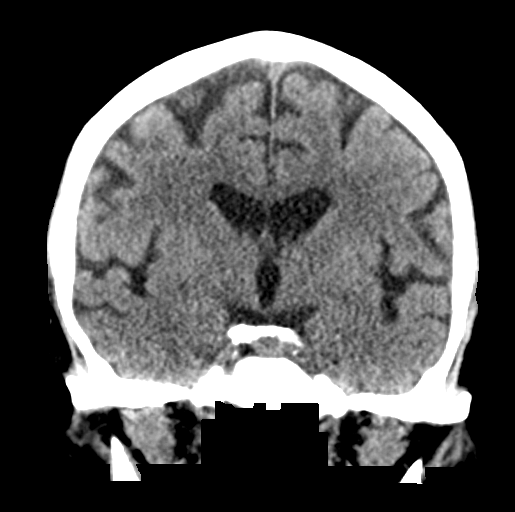

[Series 5: head without sag · sagittal · non-contrast · 0.35mm/px · 3 of 58 slices shown]
[im 20/58  brain]
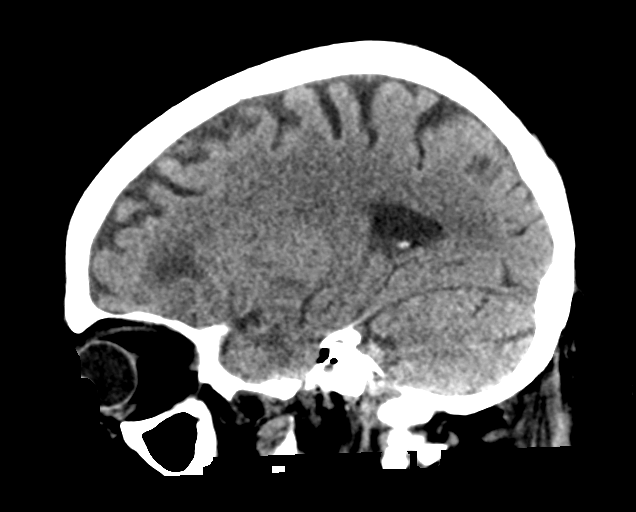
[im 29/58  brain]
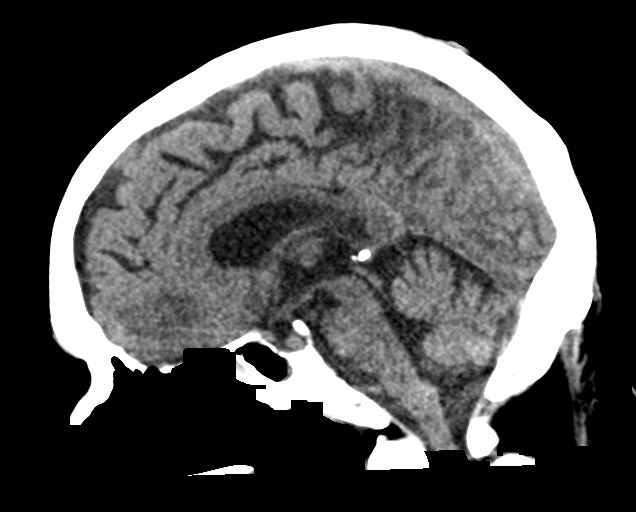
[im 39/58  brain]
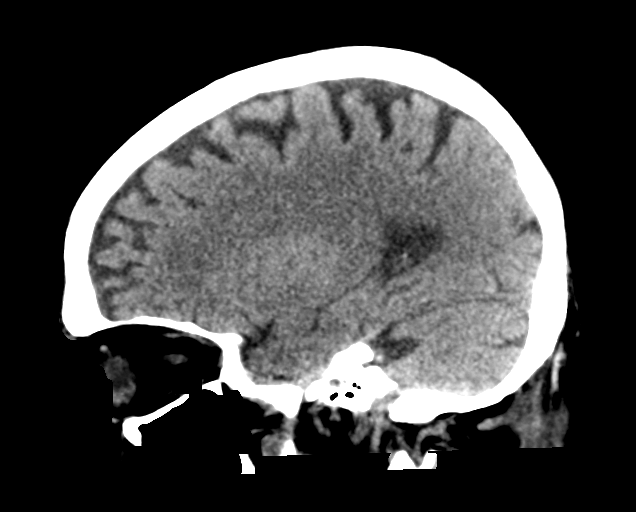

[16 of 47 positions shown; findings below may reference images not displayed]

FINDINGS: Brain: Hypodensity in the right inferior frontal gyrus most
suggestive of mass related vasogenic edema (series 2, image 14).
Mild regional mass effect including on the right frontal horn.

Additional edema suspected in the right middle frontal gyrus on
coronal image 20. Additionally, there is probable edema in the
anterior right temporal lobe best seen on sagittal image 20. But no
left hemisphere or posterior fossa vasogenic edema identified.

No associated intracranial hemorrhage. No midline shift or loss of
basilar cisterns. No ventriculomegaly. No cortically based acute
infarct identified.

Vascular: Calcified atherosclerosis at the skull base. No suspicious
intracranial vascular hyperdensity.

Skull: Permeative, moth eaten appearance of bone throughout the
skull base, including the central skull base, visible mandible,
zygoma and maxilla. Smaller lytic lesions are scattered in the
calvarium mostly along the coronal sutures. No definite extraosseous
extension of tumor is identified by plain CT.

Sinuses/Orbits: Abnormal sphenoid and right posterior ethmoid sinus
opacification in conjunction with the highly abnormal appearance of
the skull base. Similar partially opacified bilateral mastoid air
cells and petrous apex air cells. The right tympanic cavity is
partially opacified.

Other: No orbit or scalp soft tissue mass identified.
IMPRESSION: 1. Diffuse skull base infiltration by tumor/metastasis, and evidence
of multifocal right hemisphere brain metastases with
mild-to-moderate vasogenic edema.
2. No significant intracranial mass effect.

## 2021-07-25 IMAGING — CT CT ABD-PELV W/O CM
2 of 4 series · 16 of 46 positions shown, 18 images · non-contrast
Comparison: None.

CLINICAL DATA: Abdominal pain, history of prostate cancer

EXAM:
CT ABDOMEN AND PELVIS WITHOUT CONTRAST
TECHNIQUE: Multidetector CT imaging of the abdomen and pelvis was performed
following the standard protocol without IV contrast.

[Series 2: a/p w/o 5mm · axial · non-contrast · 0.67mm/px · z∈[-867,-372]mm · 13 of 109 slices shown, 15 images]
[im 5/109  soft-tissue]
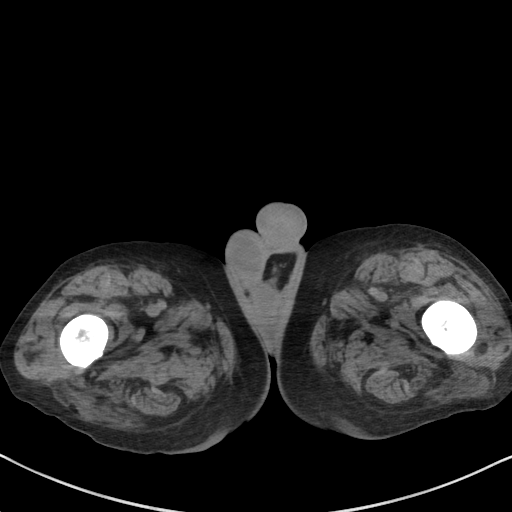
[im 5/109  bone]
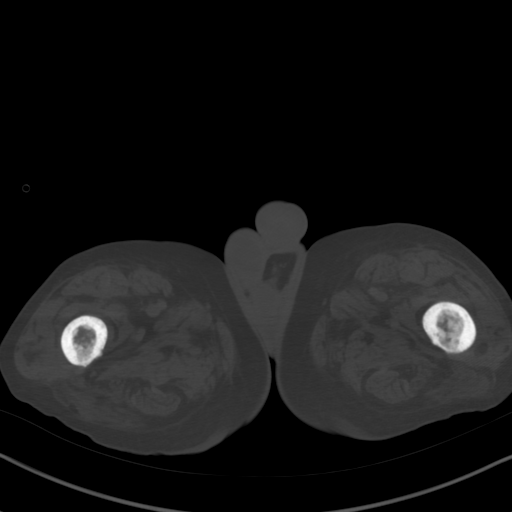
[im 13/109  soft-tissue]
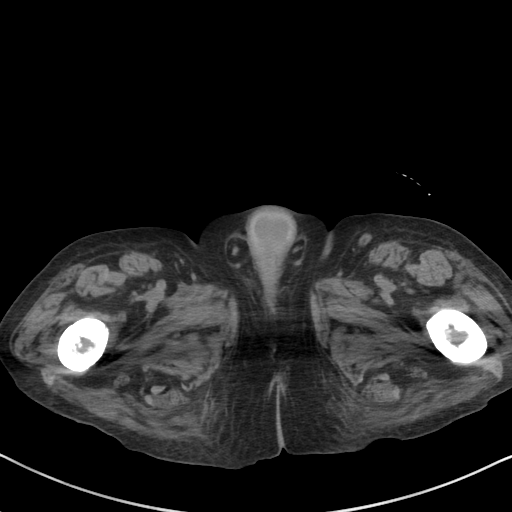
[im 21/109  soft-tissue]
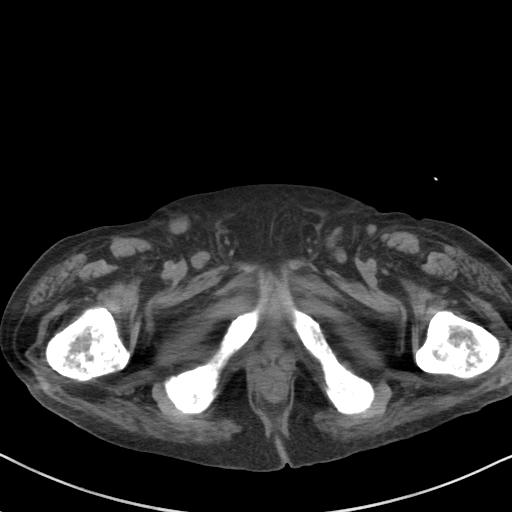
[im 30/109  soft-tissue]
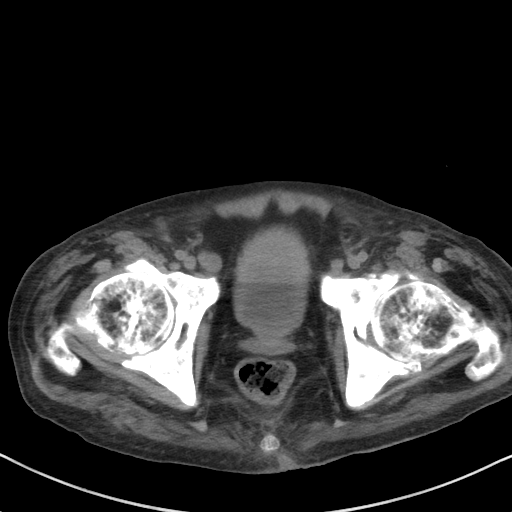
[im 38/109  soft-tissue]
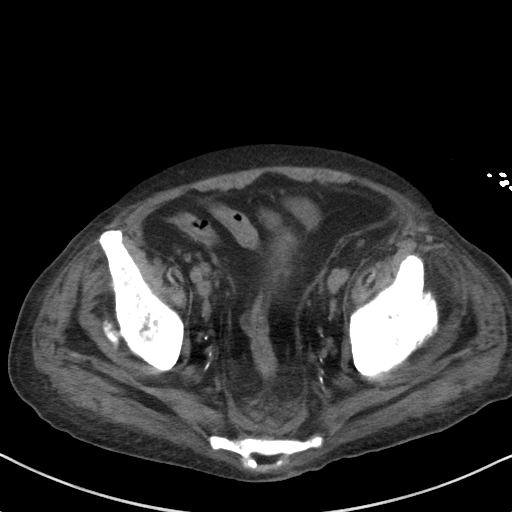
[im 46/109  soft-tissue]
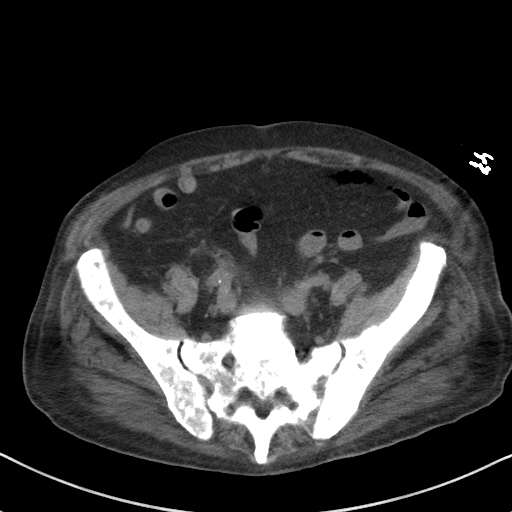
[im 55/109  soft-tissue]
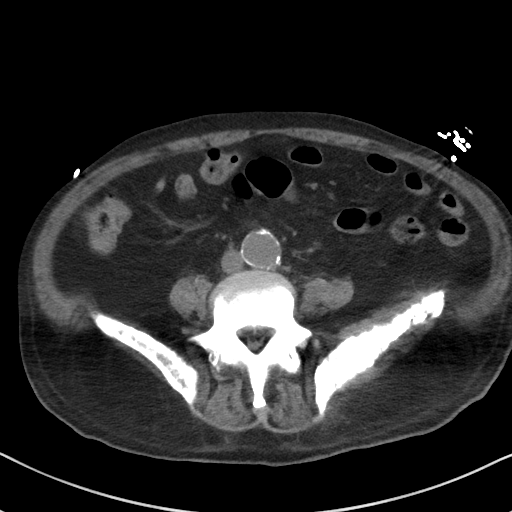
[im 63/109  soft-tissue]
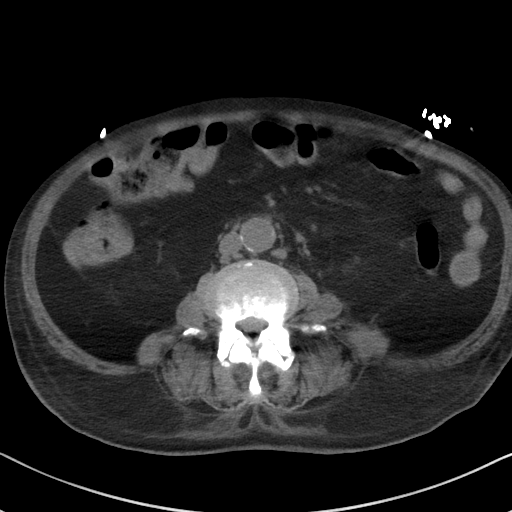
[im 71/109  soft-tissue]
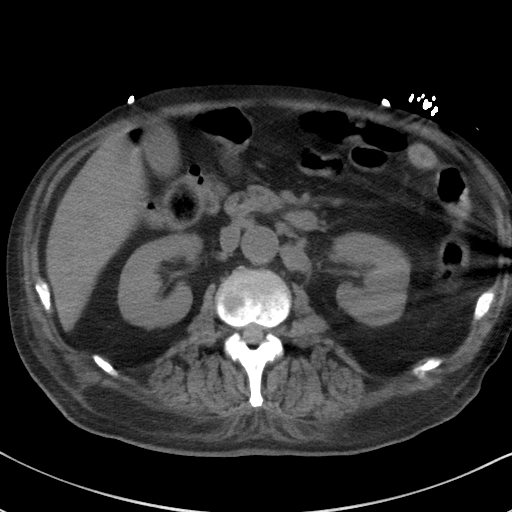
[im 71/109  bone]
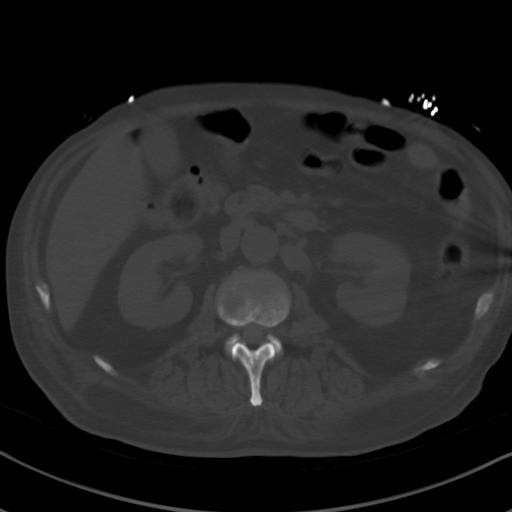
[im 79/109  soft-tissue]
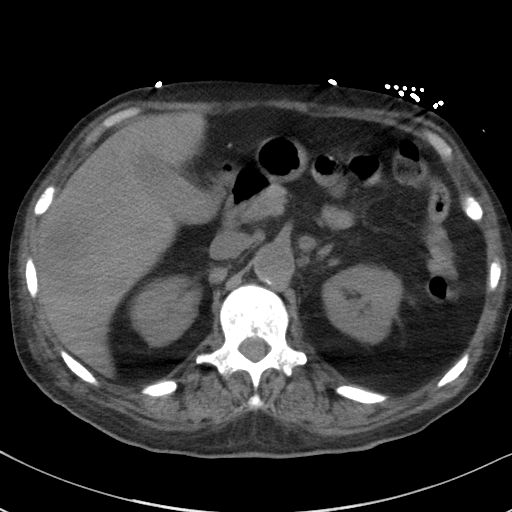
[im 88/109  soft-tissue]
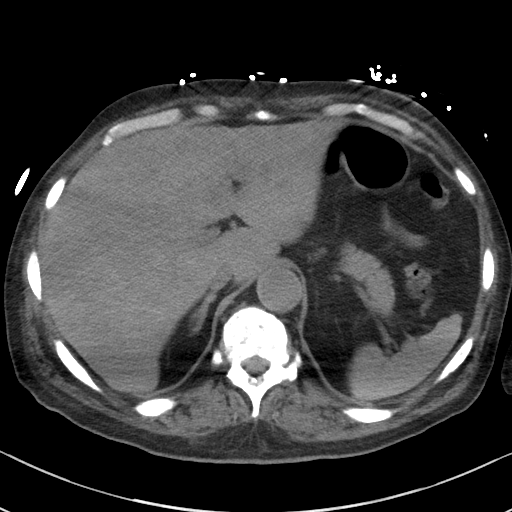
[im 96/109  soft-tissue]
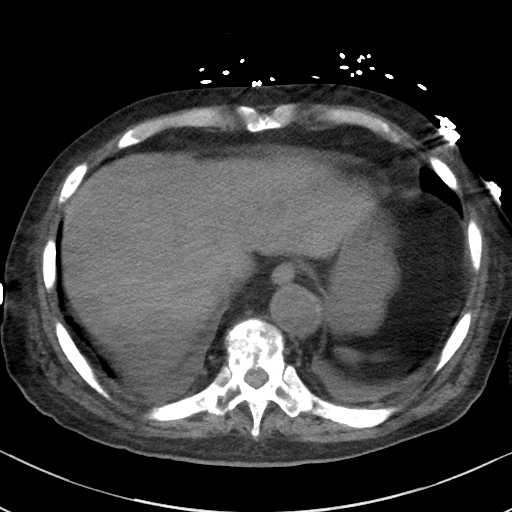
[im 104/109  soft-tissue]
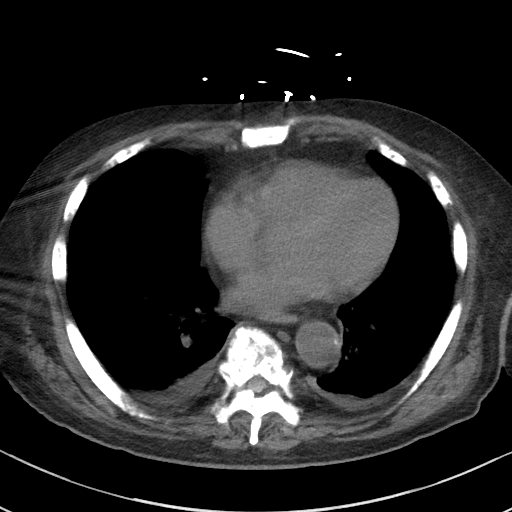

[Series 5: a/p w/o cor · coronal · non-contrast · 0.86mm/px · 3 of 151 slices shown]
[im 51/151  soft-tissue]
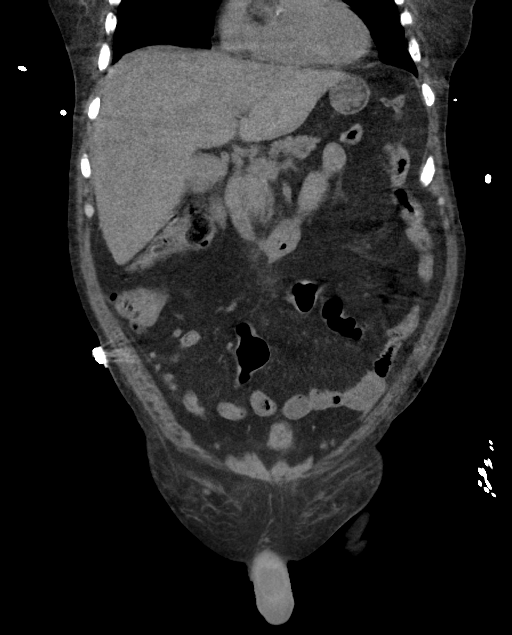
[im 67/151  soft-tissue]
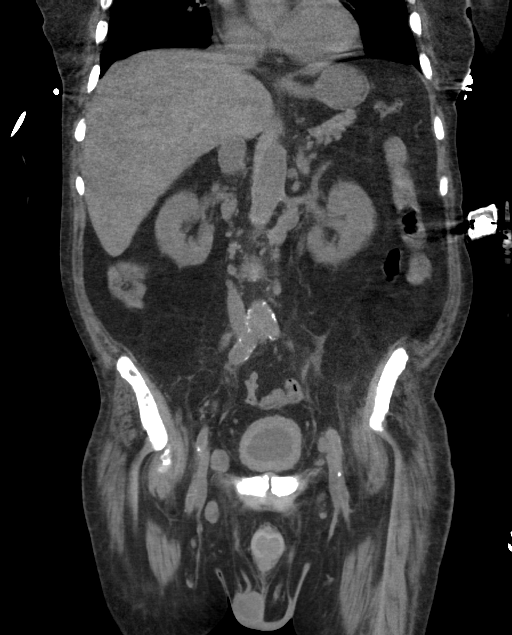
[im 84/151  soft-tissue]
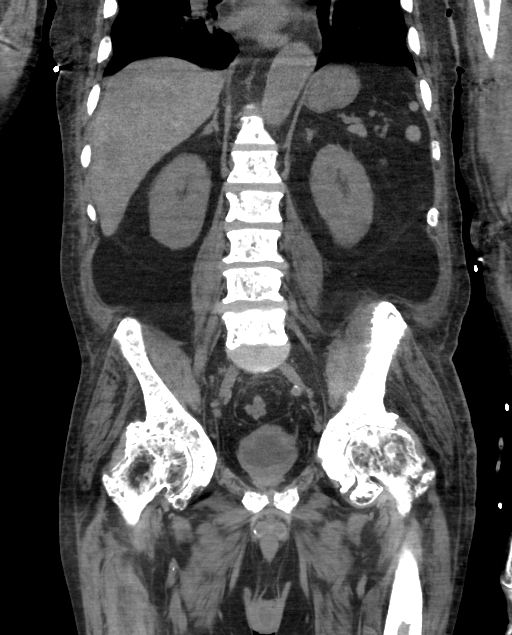

[16 of 46 positions shown; findings below may reference images not displayed]

FINDINGS: Lower chest: There are small bilateral pleural effusions. No acute
parenchymal lung disease. Incidental bilateral gynecomastia.

Hepatobiliary: Too numerous to count hypodense masses throughout the
liver compatible with diffuse hepatic metastases. Largest lesion
within the inferior right lobe measuring approximately 4.5 cm
reference image 33 of series 2. Gallbladder is unremarkable. No
biliary dilatation.

Pancreas: Pancreas is unremarkable.

Spleen: Spleen is normal in appearance and size.

Adrenals/Urinary Tract: No evidence of urinary tract calculi or
obstructive uropathy. Kidneys and adrenal glands are grossly
unremarkable. Diffuse bladder wall thickening may reflect chronic
bladder outlet obstruction.

Stomach/Bowel: No evidence of bowel obstruction or ileus. Normal
appendix right lower quadrant.

Vascular/Lymphatic: There is extensive retroperitoneal
lymphadenopathy. Index left para-aortic lymph node reference image
36 series 2 measures 18 x 22 mm. Lymphadenopathy extends to the
pelvis, with largest left external iliac chain lymph node measuring
26 x 11 mm reference image 76 of series 2. No evidence of aortic
aneurysm. Mild atherosclerosis of the distal aorta and its branches.

Reproductive: Fiduciary markers are seen within the prostate.
Prostate measures 2.8 x 3.5 by 3.1 cm.

Other: No abdominal wall hernia.  No free fluid.

Musculoskeletal: There is extensive diffuse bony metastatic disease
involving all visualized bony structures. No evidence of pathologic
fracture. Reconstructed images demonstrate no additional findings.
IMPRESSION: 1. Findings compatible with extensive metastatic prostate cancer.
Metastatic retroperitoneal lymphadenopathy. Diffuse hepatic
metastases, and diffuse skeletal metastases as above.
2. Trace bilateral pleural effusions.
